# Patient Record
Sex: Female | Born: 1935 | Race: Black or African American | Hispanic: No | Smoking: Never smoker
Health system: Southern US, Community
[De-identification: ages and names within clinical notes are randomized; demographics above are authoritative.]

## PROBLEM LIST (undated history)

## (undated) DIAGNOSIS — N3941 Urge incontinence: Secondary | ICD-10-CM

## (undated) DIAGNOSIS — F0280 Dementia in other diseases classified elsewhere without behavioral disturbance: Secondary | ICD-10-CM

## (undated) DIAGNOSIS — L89154 Pressure ulcer of sacral region, stage 4: Secondary | ICD-10-CM

## (undated) DIAGNOSIS — R54 Age-related physical debility: Secondary | ICD-10-CM

## (undated) DIAGNOSIS — G3184 Mild cognitive impairment, so stated: Secondary | ICD-10-CM

## (undated) DIAGNOSIS — Z96651 Presence of right artificial knee joint: Secondary | ICD-10-CM

## (undated) DIAGNOSIS — Z48817 Encounter for surgical aftercare following surgery on the skin and subcutaneous tissue: Secondary | ICD-10-CM

## (undated) DIAGNOSIS — E8809 Other disorders of plasma-protein metabolism, not elsewhere classified: Secondary | ICD-10-CM

## (undated) DIAGNOSIS — M159 Polyosteoarthritis, unspecified: Secondary | ICD-10-CM

## (undated) DIAGNOSIS — G309 Alzheimer's disease, unspecified: Secondary | ICD-10-CM

## (undated) DIAGNOSIS — G8929 Other chronic pain: Secondary | ICD-10-CM

## (undated) DIAGNOSIS — E119 Type 2 diabetes mellitus without complications: Secondary | ICD-10-CM

## (undated) DIAGNOSIS — R5381 Other malaise: Secondary | ICD-10-CM

## (undated) DIAGNOSIS — L89610 Pressure ulcer of right heel, unstageable: Secondary | ICD-10-CM

## (undated) DIAGNOSIS — F0390 Unspecified dementia without behavioral disturbance: Secondary | ICD-10-CM

## (undated) DIAGNOSIS — E440 Moderate protein-calorie malnutrition: Secondary | ICD-10-CM

## (undated) DIAGNOSIS — K5900 Constipation, unspecified: Secondary | ICD-10-CM

## (undated) DIAGNOSIS — M48061 Spinal stenosis, lumbar region without neurogenic claudication: Secondary | ICD-10-CM

## (undated) DIAGNOSIS — M545 Low back pain, unspecified: Secondary | ICD-10-CM

## (undated) DIAGNOSIS — E039 Hypothyroidism, unspecified: Secondary | ICD-10-CM

## (undated) DIAGNOSIS — S82301A Unspecified fracture of lower end of right tibia, initial encounter for closed fracture: Secondary | ICD-10-CM

## (undated) HISTORY — DX: Other disorders of plasma-protein metabolism, not elsewhere classified: E88.09

## (undated) HISTORY — DX: Urge incontinence: N39.41

## (undated) HISTORY — DX: Low back pain, unspecified: M54.50

## (undated) HISTORY — PX: OTHER SURGICAL HISTORY: SHX169

## (undated) HISTORY — DX: Moderate protein-calorie malnutrition: E44.0

## (undated) HISTORY — DX: Constipation, unspecified: K59.00

## (undated) HISTORY — DX: Dementia in other diseases classified elsewhere, unspecified severity, without behavioral disturbance, psychotic disturbance, mood disturbance, and anxiety: F02.80

## (undated) HISTORY — DX: Pressure ulcer of sacral region, stage 4: L89.154

## (undated) HISTORY — DX: Age-related physical debility: R54

## (undated) HISTORY — DX: Spinal stenosis, lumbar region without neurogenic claudication: M48.061

## (undated) HISTORY — DX: Low back pain: M54.5

## (undated) HISTORY — DX: Unspecified dementia, unspecified severity, without behavioral disturbance, psychotic disturbance, mood disturbance, and anxiety: F03.90

## (undated) HISTORY — DX: Unspecified fracture of lower end of right tibia, initial encounter for closed fracture: S82.301A

## (undated) HISTORY — DX: Type 2 diabetes mellitus without complications: E11.9

## (undated) HISTORY — DX: Alzheimer's disease, unspecified: G30.9

## (undated) HISTORY — DX: Pressure ulcer of right heel, unstageable: L89.610

## (undated) HISTORY — DX: Encounter for surgical aftercare following surgery on the skin and subcutaneous tissue: Z48.817

## (undated) HISTORY — DX: Presence of right artificial knee joint: Z96.651

## (undated) HISTORY — DX: Polyosteoarthritis, unspecified: M15.9

## (undated) HISTORY — DX: Other chronic pain: G89.29

## (undated) HISTORY — DX: Other malaise: R53.81

## (undated) HISTORY — DX: Hypothyroidism, unspecified: E03.9

## (undated) HISTORY — DX: Mild cognitive impairment of uncertain or unknown etiology: G31.84

## (undated) HISTORY — PX: TOTAL KNEE ARTHROPLASTY: SHX125

---

## 2017-03-20 DIAGNOSIS — L89610 Pressure ulcer of right heel, unstageable: Secondary | ICD-10-CM | POA: Insufficient documentation

## 2017-03-20 DIAGNOSIS — E440 Moderate protein-calorie malnutrition: Secondary | ICD-10-CM

## 2017-03-20 DIAGNOSIS — L89154 Pressure ulcer of sacral region, stage 4: Secondary | ICD-10-CM

## 2017-03-20 DIAGNOSIS — Z48817 Encounter for surgical aftercare following surgery on the skin and subcutaneous tissue: Secondary | ICD-10-CM

## 2017-03-20 DIAGNOSIS — E8809 Other disorders of plasma-protein metabolism, not elsewhere classified: Secondary | ICD-10-CM

## 2017-03-20 DIAGNOSIS — R5381 Other malaise: Secondary | ICD-10-CM

## 2017-03-20 HISTORY — DX: Other disorders of plasma-protein metabolism, not elsewhere classified: E88.09

## 2017-03-20 HISTORY — DX: Pressure ulcer of sacral region, stage 4: L89.154

## 2017-03-20 HISTORY — DX: Pressure ulcer of right heel, unstageable: L89.610

## 2017-03-20 HISTORY — DX: Other malaise: R53.81

## 2017-03-20 HISTORY — DX: Encounter for surgical aftercare following surgery on the skin and subcutaneous tissue: Z48.817

## 2017-03-20 HISTORY — DX: Moderate protein-calorie malnutrition: E44.0

## 2017-05-20 ENCOUNTER — Non-Acute Institutional Stay (SKILLED_NURSING_FACILITY): Payer: Medicare Other | Admitting: Internal Medicine

## 2017-05-20 ENCOUNTER — Encounter: Payer: Self-pay | Admitting: Internal Medicine

## 2017-05-20 DIAGNOSIS — K219 Gastro-esophageal reflux disease without esophagitis: Secondary | ICD-10-CM

## 2017-05-20 DIAGNOSIS — I10 Essential (primary) hypertension: Secondary | ICD-10-CM

## 2017-05-20 DIAGNOSIS — F0280 Dementia in other diseases classified elsewhere without behavioral disturbance: Secondary | ICD-10-CM | POA: Diagnosis not present

## 2017-05-20 DIAGNOSIS — E43 Unspecified severe protein-calorie malnutrition: Secondary | ICD-10-CM

## 2017-05-20 DIAGNOSIS — M159 Polyosteoarthritis, unspecified: Secondary | ICD-10-CM | POA: Insufficient documentation

## 2017-05-20 DIAGNOSIS — S82301A Unspecified fracture of lower end of right tibia, initial encounter for closed fracture: Secondary | ICD-10-CM | POA: Insufficient documentation

## 2017-05-20 DIAGNOSIS — G8929 Other chronic pain: Secondary | ICD-10-CM | POA: Insufficient documentation

## 2017-05-20 DIAGNOSIS — E119 Type 2 diabetes mellitus without complications: Secondary | ICD-10-CM | POA: Diagnosis not present

## 2017-05-20 DIAGNOSIS — G3184 Mild cognitive impairment, so stated: Secondary | ICD-10-CM | POA: Insufficient documentation

## 2017-05-20 DIAGNOSIS — A414 Sepsis due to anaerobes: Secondary | ICD-10-CM

## 2017-05-20 DIAGNOSIS — N3941 Urge incontinence: Secondary | ICD-10-CM | POA: Insufficient documentation

## 2017-05-20 DIAGNOSIS — M545 Low back pain: Secondary | ICD-10-CM

## 2017-05-20 DIAGNOSIS — L89310 Pressure ulcer of right buttock, unstageable: Secondary | ICD-10-CM

## 2017-05-20 DIAGNOSIS — F0390 Unspecified dementia without behavioral disturbance: Secondary | ICD-10-CM | POA: Insufficient documentation

## 2017-05-20 DIAGNOSIS — I824Y2 Acute embolism and thrombosis of unspecified deep veins of left proximal lower extremity: Secondary | ICD-10-CM

## 2017-05-20 DIAGNOSIS — G301 Alzheimer's disease with late onset: Secondary | ICD-10-CM | POA: Diagnosis not present

## 2017-05-20 DIAGNOSIS — M48061 Spinal stenosis, lumbar region without neurogenic claudication: Secondary | ICD-10-CM | POA: Insufficient documentation

## 2017-05-20 DIAGNOSIS — Z96651 Presence of right artificial knee joint: Secondary | ICD-10-CM | POA: Insufficient documentation

## 2017-05-20 DIAGNOSIS — E034 Atrophy of thyroid (acquired): Secondary | ICD-10-CM | POA: Diagnosis not present

## 2017-05-20 DIAGNOSIS — D649 Anemia, unspecified: Secondary | ICD-10-CM | POA: Diagnosis not present

## 2017-05-20 DIAGNOSIS — R54 Age-related physical debility: Secondary | ICD-10-CM | POA: Insufficient documentation

## 2017-05-20 DIAGNOSIS — G309 Alzheimer's disease, unspecified: Secondary | ICD-10-CM

## 2017-05-20 DIAGNOSIS — K5900 Constipation, unspecified: Secondary | ICD-10-CM | POA: Insufficient documentation

## 2017-05-20 DIAGNOSIS — E039 Hypothyroidism, unspecified: Secondary | ICD-10-CM | POA: Insufficient documentation

## 2017-05-20 DIAGNOSIS — L89154 Pressure ulcer of sacral region, stage 4: Secondary | ICD-10-CM | POA: Diagnosis not present

## 2017-05-20 NOTE — Progress Notes (Signed)
: Provider: Randon Goldsmith. Lyn Hollingshead, MD  Location:  [facility] Room Number: 323 Place of Service:  SNF ([phone])  PCP: Margit Hanks, MD Patient Care Team: Margit Hanks, MD as PCP - General (Internal Medicine)  Extended Emergency Contact Information Primary Emergency Contact: [facility] Phone: [phone] Relation: Daughter Secondary Emergency Contact: Lewis,Tammy Mobile Phone: [phone] Relation: Other     Allergies: Januvia [sitagliptin]  Chief Complaint  Patient presents with  . New Admit To SNF    following hospitalization [facility] 03/20/17 to 05/17/17 Stage III/IV sacral decubitus with undermening and eschar    HPI: Patient is 83 y.o. female with dementia, diabetes mellitus type 2, osteoarthritis, hypothyroidism, who [facility] with an infected sacral pressure injury. Patient had recently been discharged from this same facility after admission for her to thrive. Patient was admitted to [facility] health system from 10/3-11/30 where she underwent surgical debridement and placement of a wound VAC for his stage IV inspected sacral pressure ulcer. Patient also had Peptostreptococcus species bacteremia for which she was treated with IV Zosyn and additional 14 days of by mouth Augmentin planned for after discharge. There was reported much conversation between family and staff about placing of a PEG tube due to an albumin level of 1.0; however family refused an patient's albumin on discharge is 1.8. Further complications were development of a DVT of the left lower extremity initially treated with xarelto which caused nosebleed so patient was placed on aspirin and had an inferior vena cava filter placed. She also had mild hyponatremia which had resolved just prior to discharge. Patient is admitted to skilled nursing facility with generalized weakness for OT/PT. While at skilled nursing facility patient will be  followed for diabetes mellitus treated with insulin, hypothyroidism treated with Synthroid and hypertension treated with Norvasc.   Past Medical History:  Diagnosis Date  . Aftercare following surgery of the skin or subcutaneous tissue 03/20/2017  . Alzheimer disease   . Chronic low back pain   . Closed fracture of right distal tibia   . Constipation   . Debility 03/20/2017  . Dementia without behavioral disturbance   . Diabetes mellitus without complication (HCC)   . Frailty syndrome in geriatric patient   . Hypoalbuminemia 03/20/2017  . Hypothyroidism   . Mild cognitive impairment, so stated   . Osteoarthritis of multiple joints    with replacement surgeries  . Protein-calorie malnutrition, moderate (HCC) 03/20/2017  . Spinal stenosis at L4-L5 level   . Stage IV pressure ulcer of sacral region (HCC) 03/20/2017  . Total knee replacement status, right   . Type 2 diabetes mellitus (HCC)   . Unstageable pressure ulcer of right heel (HCC) 03/20/2017  . Urge incontinence     Past Surgical History:  Procedure Laterality Date  . distal tibia fracture repair Right   . TOTAL KNEE ARTHROPLASTY Right     Allergies as of 05/20/2017      Reactions   Januvia [sitagliptin]    transaminitis      Medication List        Accurate as of 05/20/17  4:19 PM. Always use your most recent med list.          amLODipine 5 MG tablet Commonly known as:  NORVASC Take 5 mg by mouth. Take one tablet daily for HTN. Hold for SBP less than 120   amoxicillin-clavulanate 500-125 MG tablet Commonly known as:  AUGMENTIN Take 1 tablet  by mouth. Take one tablet every 12 hours for 14 days for wound infection. Stop 05/31/17   aspirin 81 MG chewable tablet Chew by mouth daily.   GLUCERNA Liqd Take 237 mLs by mouth. Take 237 ml twice daily due to weight loss   HUMALOG KWIKPEN 100 UNIT/ML KiwkPen Generic drug:  insulin lispro Inject into the skin. Sliding scale 70-120 =0 units, 121-150= 2 units,  151-200= 3 units, 201-250=5 units, 251-300=8 units, 301-350=11 units, 351-400=15 units. Greater than 400 call MD and give 15 units.   levothyroxine 112 MCG tablet Commonly known as:  SYNTHROID, LEVOTHROID Take 112 mcg by mouth daily before breakfast.   oxyCODONE-acetaminophen 5-325 MG tablet Commonly known as:  PERCOCET/ROXICET Take by mouth. Take one tablet daily 30 minute prior to wound care   pantoprazole 40 MG tablet Commonly known as:  PROTONIX Take 40 mg by mouth. Take one tablet daily   polyethylene glycol packet Commonly known as:  MIRALAX / GLYCOLAX Take 17 g by mouth. Mix with 4 oz of liquid daily for constipation   PROSTATE PO Take by mouth. Take 30 ml twice daily to aid in wound healing   vitamin C 500 MG tablet Commonly known as:  ASCORBIC ACID Take 500 mg by mouth. Take one tablet twice daily to promote wound healing   zinc sulfate 220 (50 Zn) MG capsule Take 220 mg by mouth. Take one capsule daily for 14 days to promote wound healing. Stop 05/31/17       No orders of the defined types were placed in this encounter.   Immunization History  Administered Date(s) Administered  . Influenza-Unspecified 04/18/2017  . PPD Test 05/17/2017    Social History   Tobacco Use  . Smoking status: Never Smoker  . Smokeless tobacco: Never Used  Substance Use Topics  . Alcohol use: No    Frequency: Never    Family history is   History reviewed. No pertinent family history.    Review of Systems    Vitals:   05/20/17 1601  BP: 107/60  Pulse: (!) 103  Resp: 18  Temp: 98.2 F (36.8 C)    SpO2 Readings from Last 1 Encounters:  No data found for SpO2   Body mass index is 27.43 kg/m.     Physical Exam  GENERAL APPEARANCE: Alert,  No acute distress.  SKIN: Stage IV decubitus sacrum and unstageable right gluteal ulcer dressed and not visualized HEAD: Normocephalic, atraumatic  EYES: Conjunctiva/lids clear. Pupils round, reactive. EOMs intact.  EARS:  External exam WNL, canals clear. Hearing grossly normal.  NOSE: No deformity or discharge.  MOUTH/THROAT: Lips w/o lesions  RESPIRATORY: Breathing is even, unlabored. Lung sounds are clear   CARDIOVASCULAR: Heart RRR no murmurs, rubs or gallops. No peripheral edema.   GASTROINTESTINAL: Abdomen is soft, non-tender, not distended w/ normal bowel sounds. GENITOURINARY: Bladder non tender, not distended  MUSCULOSKELETAL: No abnormal joints or musculature NEUROLOGIC:  Cranial nerves 2-12 grossly intact. Moves all extremities  PSYCHIATRIC: Mood and affect with dementia, no behavioral issues  Patient Active Problem List   Diagnosis Date Noted  . Alzheimer disease   . Chronic low back pain   . Closed fracture of right distal tibia   . Constipation   . Dementia without behavioral disturbance   . Diabetes mellitus without complication (HCC)   . Frailty syndrome in geriatric patient   . Hypothyroidism   . Mild cognitive impairment, so stated   . Osteoarthritis of multiple joints   . Spinal stenosis  at L4-L5 level   . Total knee replacement status, right   . Type 2 diabetes mellitus (HCC)   . Urge incontinence   . Aftercare following surgery of the skin or subcutaneous tissue 03/20/2017  . Debility 03/20/2017  . Hypoalbuminemia 03/20/2017  . Protein-calorie malnutrition, moderate (HCC) 03/20/2017  . Stage IV pressure ulcer of sacral region (HCC) 03/20/2017  . Unstageable pressure ulcer of right heel (HCC) 03/20/2017      Labs reviewed: Basic Metabolic Panel: No results found for: NA, K, CL, CO2, GLUCOSE, BUN, CREATININE, CALCIUM, PROT, ALBUMIN, AST, ALT, ALKPHOS, BILITOT, GFRNONAA, GFRAA  No results for input(s): NA, K, CL, CO2, GLUCOSE, BUN, CREATININE, CALCIUM, MG, PHOS in the last 8760 hours. Liver Function Tests: No results for input(s): AST, ALT, ALKPHOS, BILITOT, PROT, ALBUMIN in the last 8760 hours. No results for input(s): LIPASE, AMYLASE in the last 8760 hours. No results  for input(s): AMMONIA in the last 8760 hours. CBC: No results for input(s): WBC, NEUTROABS, HGB, HCT, MCV, PLT in the last 8760 hours. Lipid No results for input(s): CHOL, HDL, LDLCALC, TRIG in the last 8760 hours.  Cardiac Enzymes: No results for input(s): CKTOTAL, CKMB, CKMBINDEX, TROPONINI in the last 8760 hours. BNP: No results for input(s): BNP in the last 8760 hours. No results found for: MICROALBUR No results found for: HGBA1C No results found for: TSH No results found for: VITAMINB12 No results found for: FOLATE No results found for: IRON, TIBC, FERRITIN  Imaging and Procedures obtained prior to SNF admission: Patient was never admitted.   Not all labs, radiology exams or other studies done during hospitalization come through on my EPIC note; however they are reviewed by me.    Assessment and Plan  Infected stage IV sacral decubitus ulcer/unstageable right gluteal ulcer-status post surgical debridement on 04/04/2017 with placement of wound VAC SNF -admitted to skilled nursing facility for OT/PT and for wound care of sacral decubitus ulcer and right gluteal ulcer  PEPTOSTREPTOCOCCUS bacteremia-treated with 28 days of Zosyn SNF - Augmentin 500 mg every 12 hours for 14 more days in date 05/31/2017  SEVERE PROTEIN MALNUTRITION-patient's albumin was 1 on admission and 1.8 on discharge 2 months later  SNF-low-carb diet with multiple supplements  LEFT LOWER EXTREMITY DVT-was started on xarelto had nosebleeds so was changed to aspirin 81 mg and a inferior vena cava filter was placed. SNF - continue ASA 81 mg daily as prophylaxis  Diabetes mellitus type 2 SNF - nothing useful like a laboratory accompanies all these papers; was not stated as a problem therefore was admitted were no problems; plan to continue sliding scale insulin before meals   DEMENTIA WITHOUT BEHAVIORS-patient on no specific medications for Alzheimer's, it appears the patient has been on Risperdal 2 mg twice  a day which was stopped during her hospitalization SNF - will monitor patient had antipsychotics if needed  HYPOTHYROIDISM SNF - not stated as uncontrolled; plan to continue Synthroid 112 g daily  HYPERTENSION SNF - not stated as uncontrolled; plan to continue Norvasc 5 mg by mouth daily  GERD SNF - not stated as uncontrolled; plan to continue Protonix 40 mg by mouth daily  CHRONIC ANEMIA SNF - type not stated; no labs; have ordered CBC   Time spent greater than 45 minutes ;> 50% of time with patient was spent reviewing records, labs, tests and studies, counseling and developing plan of care  Thurston Holenne D. Lyn HollingsheadAlexander, MD

## 2017-05-23 ENCOUNTER — Other Ambulatory Visit: Payer: Self-pay

## 2017-05-23 LAB — BASIC METABOLIC PANEL
BUN: 13 (ref 4–21)
CREATININE: 0.3 — AB (ref 0.5–1.1)
GLUCOSE: 121
POTASSIUM: 4.7 (ref 3.4–5.3)
SODIUM: 132 — AB (ref 137–147)

## 2017-05-23 LAB — CBC AND DIFFERENTIAL
HCT: 31 — AB (ref 36–46)
Hemoglobin: 10.3 — AB (ref 12.0–16.0)
Platelets: 357 (ref 150–399)
WBC: 6.7

## 2017-05-28 ENCOUNTER — Encounter: Payer: Self-pay | Admitting: Internal Medicine

## 2017-05-28 DIAGNOSIS — I82409 Acute embolism and thrombosis of unspecified deep veins of unspecified lower extremity: Secondary | ICD-10-CM | POA: Insufficient documentation

## 2017-05-28 DIAGNOSIS — I10 Essential (primary) hypertension: Secondary | ICD-10-CM | POA: Insufficient documentation

## 2017-05-28 DIAGNOSIS — A414 Sepsis due to anaerobes: Secondary | ICD-10-CM | POA: Insufficient documentation

## 2017-05-28 DIAGNOSIS — L89300 Pressure ulcer of unspecified buttock, unstageable: Secondary | ICD-10-CM | POA: Insufficient documentation

## 2017-05-28 DIAGNOSIS — K219 Gastro-esophageal reflux disease without esophagitis: Secondary | ICD-10-CM | POA: Insufficient documentation

## 2017-05-28 DIAGNOSIS — D649 Anemia, unspecified: Secondary | ICD-10-CM | POA: Insufficient documentation

## 2017-05-28 DIAGNOSIS — E43 Unspecified severe protein-calorie malnutrition: Secondary | ICD-10-CM | POA: Insufficient documentation

## 2017-06-03 LAB — HEPATIC FUNCTION PANEL
ALK PHOS: 175 — AB (ref 25–125)
ALT: 24 (ref 7–35)
AST: 48 — AB (ref 13–35)
BILIRUBIN, TOTAL: 0.2

## 2017-06-03 LAB — BASIC METABOLIC PANEL
BUN: 14 (ref 4–21)
Creatinine: 0.4 — AB (ref 0.5–1.1)
GLUCOSE: 229
Potassium: 4.2 (ref 3.4–5.3)
SODIUM: 139 (ref 137–147)

## 2017-06-03 LAB — CBC AND DIFFERENTIAL
HEMATOCRIT: 34 — AB (ref 36–46)
HEMOGLOBIN: 10.9 — AB (ref 12.0–16.0)
Platelets: 363 (ref 150–399)
WBC: 9

## 2017-06-04 ENCOUNTER — Other Ambulatory Visit: Payer: Self-pay

## 2017-06-12 LAB — BASIC METABOLIC PANEL
BUN: 17 (ref 4–21)
Creatinine: 0.4 — AB (ref 0.5–1.1)
GLUCOSE: 174
POTASSIUM: 4 (ref 3.4–5.3)
SODIUM: 141 (ref 137–147)

## 2017-06-12 LAB — CBC AND DIFFERENTIAL
HCT: 31 — AB (ref 36–46)
HEMOGLOBIN: 9.6 — AB (ref 12.0–16.0)
Platelets: 353 (ref 150–399)
WBC: 9.5

## 2017-06-12 LAB — HEPATIC FUNCTION PANEL
ALK PHOS: 132 — AB (ref 25–125)
ALT: 14 (ref 7–35)
AST: 22 (ref 13–35)
Bilirubin, Total: 0.3

## 2017-06-17 ENCOUNTER — Non-Acute Institutional Stay (SKILLED_NURSING_FACILITY): Payer: Medicare Other | Admitting: Internal Medicine

## 2017-06-17 DIAGNOSIS — M7989 Other specified soft tissue disorders: Secondary | ICD-10-CM

## 2017-06-19 ENCOUNTER — Encounter: Payer: Self-pay | Admitting: Internal Medicine

## 2017-06-19 NOTE — Progress Notes (Addendum)
Location:  [facility] Room Number: 323 Place of Service:  SNF [phone])  Provider: Randon Goldsmith. Lyn Hollingshead, MD  Margit Hanks, MD  Patient Care Team: Margit Hanks, MD as PCP - General (Internal Medicine)  Extended Emergency Contact Information Primary Emergency Contact: [facility] Phone: [phone] Relation: Daughter Secondary Emergency Contact: Lewis,Tammy Mobile Phone: [phone] Relation: Other    Allergies: Januvia [sitagliptin]  Chief Complaint  Patient presents with  . Acute Visit    swelling right upper arm    HPI: Patient is 84 y.o. female who nursing asked me to see today because they have noted that her right upper extremity is swollen. Receiving his insulin for maybe several days. Patient does have a PICC line in that extremity.  Past Medical History:  Diagnosis Date  . Aftercare following surgery of the skin or subcutaneous tissue 03/20/2017  . Alzheimer disease   . Chronic low back pain   . Closed fracture of right distal tibia   . Constipation   . Debility 03/20/2017  . Dementia without behavioral disturbance   . Diabetes mellitus without complication (HCC)   . Frailty syndrome in geriatric patient   . Hypoalbuminemia 03/20/2017  . Hypothyroidism   . Mild cognitive impairment, so stated   . Osteoarthritis of multiple joints    with replacement surgeries  . Protein-calorie malnutrition, moderate (HCC) 03/20/2017  . Spinal stenosis at L4-L5 level   . Stage IV pressure ulcer of sacral region (HCC) 03/20/2017  . Total knee replacement status, right   . Type 2 diabetes mellitus (HCC)   . Unstageable pressure ulcer of right heel (HCC) 03/20/2017  . Urge incontinence     Past Surgical History:  Procedure Laterality Date  . distal tibia fracture repair Right   . TOTAL KNEE ARTHROPLASTY Right     Allergies as of 06/17/2017      Reactions   Januvia [sitagliptin]    transaminitis      Medication List        Accurate as of 06/17/17 11:59 PM. Always use your most recent med list.          amLODipine 5 MG tablet Commonly known as:  NORVASC Take 5 mg by mouth. Take one tablet daily for HTN. Hold for SBP less than 120   aspirin 81 MG chewable tablet Chew by mouth daily.   GLUCERNA Liqd Take 237 mLs by mouth. Take 237 ml twice daily due to weight loss   HUMALOG KWIKPEN 100 UNIT/ML KiwkPen Generic drug:  insulin lispro Inject into the skin. Sliding scale 70-120 =0 units, 121-150= 2 units, 151-200= 3 units, 201-250=5 units, 251-300=8 units, 301-350=11 units, 351-400=15 units. Greater than 400 call MD and give 15 units.   levothyroxine 112 MCG tablet Commonly known as:  SYNTHROID, LEVOTHROID Take 112 mcg by mouth daily before breakfast.   oxyCODONE-acetaminophen 5-325 MG tablet Commonly known as:  PERCOCET/ROXICET Take by mouth. Take one tablet daily 30 minute prior to wound care   pantoprazole 40 MG tablet Commonly known as:  PROTONIX Take 40 mg by mouth. Take one tablet daily   polyethylene glycol packet Commonly known as:  MIRALAX / GLYCOLAX Take 17 g by mouth. Mix with 4 oz of liquid daily for constipation   PROSTATE PO Take by mouth. Take 30 ml twice daily to aid in wound healing   vitamin C 500 MG tablet Commonly known as:  ASCORBIC ACID Take 500 mg by mouth. Take one tablet twice daily  to promote wound healing       No orders of the defined types were placed in this encounter.   Immunization History  Administered Date(s) Administered  . Influenza-Unspecified 04/18/2017  . PPD Test 05/17/2017    Social History   Tobacco Use  . Smoking status: Never Smoker  . Smokeless tobacco: Never Used  Substance Use Topics  . Alcohol use: No    Frequency: Never    Review of Systems  DATA OBTAINED: from patient-. Limited; nursing-as per history of present illness GENERAL:  no fevers, fatigue, appetite changes SKIN: No itching, rash HEENT: No complaint RESPIRATORY:  No cough, wheezing, SOB CARDIAC: No chest pain, palpitations, right upper extremity edema  GI: No abdominal pain, No N/V/D or constipation, No heartburn or reflux  GU: No dysuria, frequency or urgency, or incontinence  MUSCULOSKELETAL: No unrelieved bone/joint pain NEUROLOGIC: No headache, dizziness  PSYCHIATRIC: No overt anxiety or sadness  Vitals:   06/17/17 1545  BP: 112/69  Pulse: (!) 110  Resp: 18  Temp: (!) 97 F (36.1 C)  SpO2: 97%   Body mass index is 28.17 kg/m. Physical Exam  GENERAL APPEARANCE: Alert, No acute distress  SKIN: No diaphoresis rash HEENT: Unremarkable RESPIRATORY: Breathing is even, unlabored. Lung sounds are clear   CARDIOVASCULAR: Heart RRR 4/6 high pitched murmur, no rubs or gallops. RUE with significant swelling GASTROINTESTINAL: Abdomen is soft, non-tender, not distended w/ normal bowel sounds.  GENITOURINARY: Bladder non tender, not distended  MUSCULOSKELETAL: No abnormal joints or musculature NEUROLOGIC: Cranial nerves 2-12 grossly intact. Moves all extremities PSYCHIATRIC: Mood and affect with dementia, no behavioral issues  Patient Active Problem List   Diagnosis Date Noted  . Decubitus ulcer of buttock, unstageable (HCC) 05/28/2017  . Sepsis due to Peptostreptococcus species (HCC) 05/28/2017  . Severe protein-calorie malnutrition (HCC) 05/28/2017  . Acute deep vein thrombosis (DVT) of lower extremity (HCC) 05/28/2017  . Hypertension 05/28/2017  . GERD (gastroesophageal reflux disease) 05/28/2017  . Chronic anemia 05/28/2017  . Alzheimer disease   . Chronic low back pain   . Closed fracture of right distal tibia   . Constipation   . Dementia without behavioral disturbance   . Diabetes mellitus without complication (HCC)   . Frailty syndrome in geriatric patient   . Hypothyroidism   . Mild cognitive impairment, so stated   . Osteoarthritis of multiple joints   . Spinal stenosis at L4-L5 level   . Total knee replacement status, right    . Type 2 diabetes mellitus (HCC)   . Urge incontinence   . Aftercare following surgery of the skin or subcutaneous tissue 03/20/2017  . Debility 03/20/2017  . Hypoalbuminemia 03/20/2017  . Protein-calorie malnutrition, moderate (HCC) 03/20/2017  . Stage IV pressure ulcer of sacral region (HCC) 03/20/2017  . Unstageable pressure ulcer of right heel (HCC) 03/20/2017    CMP     Component Value Date/Time   NA 139 06/03/2017   K 4.2 06/03/2017   BUN 14 06/03/2017   CREATININE 0.4 (A) 06/03/2017   AST 48 (A) 06/03/2017   ALT 24 06/03/2017   ALKPHOS 175 (A) 06/03/2017   Recent Labs    05/23/17 06/03/17  NA 132* 139  K 4.7 4.2  BUN 13 14  CREATININE 0.3* 0.4*   Recent Labs    06/03/17  AST 48*  ALT 24  ALKPHOS 175*   Recent Labs    05/23/17 06/03/17  WBC 6.7 9.0  HGB 10.3* 10.9*  HCT 31* 34*  PLT  357 363   No results for input(s): CHOL, LDLCALC, TRIG in the last 8760 hours.  Invalid input(s): HCL No results found for: MICROALBUR No results found for: TSH No results found for: HGBA1C No results found for: CHOL, HDL, LDLCALC, LDLDIRECT, TRIG, CHOLHDL  Significant Diagnostic Results in last 30 days:  No results found.  Assessment and Plan  Right upper extremity swelling-patient is at risk for DVT with a PICC line indwelling; have ordered ultrasound    [name]. Lyn Hollingshead, MD

## 2017-06-23 ENCOUNTER — Encounter: Payer: Self-pay | Admitting: Internal Medicine

## 2017-06-24 LAB — CBC AND DIFFERENTIAL
HEMATOCRIT: 27 — AB (ref 36–46)
HEMOGLOBIN: 8.7 — AB (ref 12.0–16.0)
PLATELETS: 311 (ref 150–399)
WBC: 8.1

## 2017-06-24 LAB — BASIC METABOLIC PANEL
BUN: 13 (ref 4–21)
CREATININE: 0.3 — AB (ref 0.5–1.1)
Glucose: 152
Potassium: 3.2 — AB (ref 3.4–5.3)
Sodium: 142 (ref 137–147)

## 2017-06-24 LAB — HEPATIC FUNCTION PANEL
ALK PHOS: 137 — AB (ref 25–125)
ALT: 10 (ref 7–35)
AST: 18 (ref 13–35)
Bilirubin, Total: 0.4

## 2017-06-25 ENCOUNTER — Encounter: Payer: Self-pay | Admitting: Internal Medicine

## 2017-06-25 ENCOUNTER — Non-Acute Institutional Stay (SKILLED_NURSING_FACILITY): Payer: Medicare Other | Admitting: Internal Medicine

## 2017-06-25 DIAGNOSIS — K219 Gastro-esophageal reflux disease without esophagitis: Secondary | ICD-10-CM

## 2017-06-25 DIAGNOSIS — I10 Essential (primary) hypertension: Secondary | ICD-10-CM | POA: Diagnosis not present

## 2017-06-25 DIAGNOSIS — I824Y2 Acute embolism and thrombosis of unspecified deep veins of left proximal lower extremity: Secondary | ICD-10-CM | POA: Diagnosis not present

## 2017-06-25 NOTE — Progress Notes (Signed)
[facility] Room Number: 323P Place of Service:  SNF (31)  [name], Shakendra Griffeth D, MD  Patient Care Team: [name], Horton Ellithorpe D, MD as PCP - General (Internal Medicine)  Extended Emergency Contact Information Primary Emergency Contact: [facility]Mazie Mobile Phone: [phone] Relation: Daughter Secondary Emergency Contact: Lewis,Tammy Mobile Phone: [phone] Relation: Other    Allergies: Januvia [sitagliptin]  Chief Complaint  Patient presents with  . Medical Management of Chronic Issues    Routine Visit    HPI: Patient is 84 y.o. female who is being seen for routine issues of acute DVT, GERD, and hypertension.  Past Medical History:  Diagnosis Date  . Aftercare following surgery of the skin or subcutaneous tissue 03/20/2017  . Alzheimer disease   . Chronic low back pain   . Closed fracture of right distal tibia   . Constipation   . Debility 03/20/2017  . Dementia without behavioral disturbance   . Diabetes mellitus without complication (HCC)   . Frailty syndrome in geriatric patient   . Hypoalbuminemia 03/20/2017  . Hypothyroidism   . Mild cognitive impairment, so stated   . Osteoarthritis of multiple joints    with replacement surgeries  . Protein-calorie malnutrition, moderate (HCC) 03/20/2017  . Spinal stenosis at L4-L5 level   . Stage IV pressure ulcer of sacral region (HCC) 03/20/2017  . Total knee replacement status, right   . Type 2 diabetes mellitus (HCC)   . Unstageable pressure ulcer of right heel (HCC) 03/20/2017  . Urge incontinence     Past Surgical History:  Procedure Laterality Date  . distal tibia fracture repair Right   . TOTAL KNEE ARTHROPLASTY Right     Allergies as of 06/25/2017      Reactions   Januvia [sitagliptin]    transaminitis      Medication List        Accurate as of 06/25/17 11:59 PM. Always use your most recent med list.          amLODipine 5 MG tablet Commonly known as:   NORVASC Take 5 mg by mouth daily. Hold for SBP less than 120   aspirin 81 MG chewable tablet Chew by mouth daily.   GLUCERNA Liqd Take 237 mLs by mouth 2 (two) times daily between meals. due to weight loss   HUMALOG KWIKPEN 100 UNIT/ML KiwkPen Generic drug:  insulin lispro Inject into the skin. Sliding scale 70-120 =0 units, 121-150= 2 units, 151-200= 3 units, 201-250=5 units, 251-300=8 units, 301-350=11 units, 351-400=15 units. Greater than 400 call MD and give 15 units.   levothyroxine 112 MCG tablet Commonly known as:  SYNTHROID, LEVOTHROID Take 112 mcg by mouth daily before breakfast.   oxyCODONE-acetaminophen 5-325 MG tablet Commonly known as:  PERCOCET/ROXICET Take by mouth. Take one tablet daily 30 minute prior to wound care   pantoprazole 40 MG tablet Commonly known as:  PROTONIX Take 40 mg by mouth daily.   polyethylene glycol packet Commonly known as:  MIRALAX / GLYCOLAX Take 17 g by mouth daily.   PROSTATE PO Take by mouth. Take 30 ml twice daily to aid in wound healing   vitamin C 500 MG tablet Commonly known as:  ASCORBIC ACID Take 500 mg by mouth 2 (two) times daily. promote wound healing       No orders of the defined types were placed in this encounter.   Immunization History  Administered Date(s) Administered  . Influenza-Unspecified 04/18/2017  . PPD Test 05/17/2017    Social  History   Tobacco Use  . Smoking status: Never Smoker  . Smokeless tobacco: Never Used  Substance Use Topics  . Alcohol use: No    Frequency: Never    Review of Systems  unable to obtain secondary to dementia; nursing-no concerns except patient has poor appetite    Vitals:   06/25/17 1150  BP: 137/85  Pulse: 90  Resp: 19  Temp: 98.1 F (36.7 C)  SpO2: 98%   Body mass index is 27.12 kg/m. Physical Exam  GENERAL APPEARANCE: Alert,  No acute distress  SKIN: Stage IV sacral decubitus dressed and not visualized HEENT: Unremarkable RESPIRATORY: Breathing  is even, unlabored. Lung sounds are clear   CARDIOVASCULAR: Heart RRR no murmurs, rubs or gallops. No peripheral edema  GASTROINTESTINAL: Abdomen is soft, non-tender, not distended w/ normal bowel sounds.  GENITOURINARY: Bladder non tender, not distended  MUSCULOSKELETAL: No abnormal joints or musculature NEUROLOGIC: Cranial nerves 2-12 grossly intact. Moves all extremities PSYCHIATRIC: Mood and affect dementia, no behavioral issues  Patient Active Problem List   Diagnosis Date Noted  . Decubitus ulcer of buttock, unstageable (HCC) 05/28/2017  . Sepsis due to Peptostreptococcus species (HCC) 05/28/2017  . Severe protein-calorie malnutrition (HCC) 05/28/2017  . Acute deep vein thrombosis (DVT) of lower extremity (HCC) 05/28/2017  . Hypertension 05/28/2017  . GERD (gastroesophageal reflux disease) 05/28/2017  . Chronic anemia 05/28/2017  . Alzheimer disease   . Chronic low back pain   . Closed fracture of right distal tibia   . Constipation   . Dementia without behavioral disturbance   . Diabetes mellitus without complication (HCC)   . Frailty syndrome in geriatric patient   . Hypothyroidism   . Mild cognitive impairment, so stated   . Osteoarthritis of multiple joints   . Spinal stenosis at L4-L5 level   . Total knee replacement status, right   . Type 2 diabetes mellitus (HCC)   . Urge incontinence   . Aftercare following surgery of the skin or subcutaneous tissue 03/20/2017  . Debility 03/20/2017  . Hypoalbuminemia 03/20/2017  . Protein-calorie malnutrition, moderate (HCC) 03/20/2017  . Stage IV pressure ulcer of sacral region (HCC) 03/20/2017  . Unstageable pressure ulcer of right heel (HCC) 03/20/2017    CMP     Component Value Date/Time   NA 140 07/08/2017   NA 141 07/02/2017   K 4.3 07/08/2017   K 4.2 07/02/2017   BUN 12 07/08/2017   CREATININE 0.5 07/08/2017   CREATININE 0.39 07/02/2017   CALCIUM 8.3 07/02/2017   PROT 4.8 07/02/2017   ALBUMIN 2.2 07/02/2017    AST 23 07/08/2017   AST 19 07/02/2017   ALT 9 07/08/2017   ALT 9 07/02/2017   ALKPHOS 164 (A) 07/08/2017   ALKPHOS 153 07/02/2017   BILITOT 0.3 07/02/2017   Recent Labs    06/24/17 07/02/17 07/08/17  NA 142 141  141 140  K 3.2* 4.2  4.2 4.3  BUN 13 12 12   CREATININE 0.3* 0.4*  0.39 0.5  CALCIUM  --  8.3  --    Recent Labs    07/02/17 07/08/17  AST 19  19 23   ALT 9  9 9   ALKPHOS 153*  153 164*  BILITOT 0.3  --   PROT 4.8  --   ALBUMIN 2.2  --    Recent Labs    06/24/17 07/02/17 07/08/17  WBC 8.1 9.8  9.8 9.8  HGB 8.7* 8.5*  8.5 8.2*  HCT 27* 26*  25.8 26*  PLT 311 339 348   No results for input(s): CHOL, LDLCALC, TRIG in the last 8760 hours.  Invalid input(s): HCL No results found for: MICROALBUR No results found for: TSH No results found for: HGBA1C No results found for: CHOL, HDL, LDLCALC, LDLDIRECT, TRIG, CHOLHDL  Significant Diagnostic Results in last 30 days:  No results found.  Assessment and Plan  Acute deep vein thrombosis (DVT) of lower extremity (HCC) Patient developed nosebleed on xarelto so patient is now on ASA 81 mg and has a vena cava filter  Hypertension Will controlled; continue Norvasc 5 mg by mouth daily  GERD (gastroesophageal reflux disease) No reports of reflux; continue Protonix 40 mg by mouth daily    Sara Gross D.Lyn Hollingshead, MD

## 2017-07-02 LAB — COMPLETE METABOLIC PANEL WITH GFR
ALBUMIN: 2.2
ALT: 9
AST: 19
Alkaline Phosphatase: 153
BILIRUBIN TOTAL: 0.3
BUN: 12 (ref 4–21)
Calcium: 8.3
Creat: 0.39
GLUCOSE: 224
POTASSIUM: 4.2
SODIUM: 141
Total Protein: 4.8 g/dL

## 2017-07-02 LAB — CBC
HCT: 25.8
HEMOGLOBIN: 8.5
PLATELET COUNT: 339
WBC: 9.8

## 2017-07-02 LAB — CBC AND DIFFERENTIAL
HEMATOCRIT: 26 — AB (ref 36–46)
Hemoglobin: 8.5 — AB (ref 12.0–16.0)
Platelets: 339 (ref 150–399)
WBC: 9.8

## 2017-07-02 LAB — HEPATIC FUNCTION PANEL
ALK PHOS: 153 — AB (ref 25–125)
ALT: 9 (ref 7–35)
AST: 19 (ref 13–35)
Bilirubin, Total: 0.3

## 2017-07-02 LAB — BASIC METABOLIC PANEL
CREATININE: 0.4 — AB (ref 0.5–1.1)
GLUCOSE: 224
Potassium: 4.2 (ref 3.4–5.3)
Sodium: 141 (ref 137–147)

## 2017-07-03 ENCOUNTER — Encounter: Payer: Self-pay | Admitting: *Deleted

## 2017-07-08 LAB — HEPATIC FUNCTION PANEL
ALT: 9 (ref 7–35)
AST: 23 (ref 13–35)
Alkaline Phosphatase: 164 — AB (ref 25–125)
Bilirubin, Total: 0.3

## 2017-07-08 LAB — BASIC METABOLIC PANEL
BUN: 12 (ref 4–21)
Creatinine: 0.5 (ref 0.5–1.1)
GLUCOSE: 98
POTASSIUM: 4.3 (ref 3.4–5.3)
SODIUM: 140 (ref 137–147)

## 2017-07-08 LAB — CBC AND DIFFERENTIAL
HEMATOCRIT: 26 — AB (ref 36–46)
Hemoglobin: 8.2 — AB (ref 12.0–16.0)
PLATELETS: 348 (ref 150–399)
WBC: 9.8

## 2017-07-09 ENCOUNTER — Non-Acute Institutional Stay (SKILLED_NURSING_FACILITY): Payer: Medicare Other | Admitting: Internal Medicine

## 2017-07-09 ENCOUNTER — Encounter: Payer: Self-pay | Admitting: Internal Medicine

## 2017-07-09 DIAGNOSIS — E43 Unspecified severe protein-calorie malnutrition: Secondary | ICD-10-CM | POA: Diagnosis not present

## 2017-07-09 DIAGNOSIS — Z7189 Other specified counseling: Secondary | ICD-10-CM

## 2017-07-09 DIAGNOSIS — R627 Adult failure to thrive: Secondary | ICD-10-CM

## 2017-07-09 DIAGNOSIS — L89154 Pressure ulcer of sacral region, stage 4: Secondary | ICD-10-CM | POA: Diagnosis not present

## 2017-07-09 NOTE — Progress Notes (Signed)
[facility] Room Number: 323P Place of Service:  SNF (31)  Margit Hanks, MD  Patient Care Team: Margit Hanks, MD as PCP - General (Internal Medicine)  Extended Emergency Contact Information Primary Emergency Contact: [facility] Phone: [phone] Relation: Daughter Secondary Emergency Contact: Lewis,Tammy Mobile Phone: [phone] Relation: Other    Allergies: Januvia [sitagliptin]  Chief Complaint  Patient presents with  . Acute Visit    encounter for family conference    HPI: Patient is 84 y.o. female who is being seen today before I have a conference with her daughter to discuss patient's overall condition and goals of care.Nurse's made me aware that patient's daughter did not seem to understand how ill her mother was, how  little by mouth intake she was getting in, how her sacral decubitus was never going to heal and didn't seem to understand the overall picture.  Past Medical History:  Diagnosis Date  . Aftercare following surgery of the skin or subcutaneous tissue 03/20/2017  . Alzheimer disease   . Chronic low back pain   . Closed fracture of right distal tibia   . Constipation   . Debility 03/20/2017  . Dementia without behavioral disturbance   . Diabetes mellitus without complication (HCC)   . Frailty syndrome in geriatric patient   . Hypoalbuminemia 03/20/2017  . Hypothyroidism   . Mild cognitive impairment, so stated   . Osteoarthritis of multiple joints    with replacement surgeries  . Protein-calorie malnutrition, moderate (HCC) 03/20/2017  . Spinal stenosis at L4-L5 level   . Stage IV pressure ulcer of sacral region (HCC) 03/20/2017  . Total knee replacement status, right   . Type 2 diabetes mellitus (HCC)   . Unstageable pressure ulcer of right heel (HCC) 03/20/2017  . Urge incontinence     Past Surgical History:  Procedure Laterality Date  . distal tibia fracture repair Right   .  TOTAL KNEE ARTHROPLASTY Right     Allergies as of 07/09/2017      Reactions   Januvia [sitagliptin]    transaminitis      Medication List        Accurate as of 07/09/17 11:59 PM. Always use your most recent med list.          amLODipine 5 MG tablet Commonly known as:  NORVASC Take 5 mg by mouth daily. Hold for SBP less than 120   aspirin EC 81 MG tablet Take 81 mg by mouth daily.   feeding supplement (PRO-STAT SUGAR FREE 64) Liqd Take 30 mLs by mouth 2 (two) times daily.   GLUCERNA Liqd Take 237 mLs by mouth 2 (two) times daily between meals. due to weight loss   nutrition supplement (JUVEN) Pack Take 1 packet by mouth 2 (two) times daily between meals.   HUMALOG KWIKPEN 100 UNIT/ML KiwkPen Generic drug:  insulin lispro Inject into the skin. Sliding scale 70-120 =0 units, 121-150= 2 units, 151-200= 3 units, 201-250=5 units, 251-300=8 units, 301-350=11 units, 351-400=15 units. Greater than 400 call MD and give 15 units.   levothyroxine 112 MCG tablet Commonly known as:  SYNTHROID, LEVOTHROID Take 112 mcg by mouth daily before breakfast.   pantoprazole 40 MG tablet Commonly known as:  PROTONIX Take 40 mg by mouth daily.   polyethylene glycol packet Commonly known as:  MIRALAX / GLYCOLAX Take 17 g by mouth daily.   potassium chloride 10 MEQ tablet Commonly known as:  K-DUR,KLOR-CON Take 10 mEq by  mouth daily.   traMADol 50 MG tablet Commonly known as:  ULTRAM Take 50 mg by mouth every 8 (eight) hours as needed for moderate pain. Take 30 mins before wound care daily   vitamin C 500 MG tablet Commonly known as:  ASCORBIC ACID Take 500 mg by mouth 2 (two) times daily. promote wound healing       No orders of the defined types were placed in this encounter.   Immunization History  Administered Date(s) Administered  . Influenza-Unspecified 04/18/2017  . PPD Test 05/17/2017    Social History   Tobacco Use  . Smoking status: Never Smoker  . Smokeless  tobacco: Never Used  Substance Use Topics  . Alcohol use: No    Frequency: Never    Review of Systems  DATA OBTAINED: from nurse GENERAL:  no fevers,+ fatigue, +appetite changes SKIN: undermined sacral decubitus stage IV with osteomyelitis HEENT: No complaint RESPIRATORY: No cough, wheezing, SOB CARDIAC: No chest pain, palpitations, lower extremity edema  GI: No abdominal pain, No N/V/D or constipation, No heartburn or reflux  GU: No dysuria, frequency or urgency, or incontinence  MUSCULOSKELETAL: No unrelieved bone/joint pain NEUROLOGIC: No headache, dizziness  PSYCHIATRIC: No overt anxiety or sadness  Vitals:   07/09/17 1529  BP: 112/70  Pulse: 98  Resp: 18  Temp: 97.6 F (36.4 C)  SpO2: 99%   Body mass index is 26.99 kg/m. Physical Exam  GENERAL APPEARANCE: Alert, minimally conversant, No acute distress, looks very tired and weak  SKIN: No diaphoresis rash HEENT: Unremarkable RESPIRATORY: Breathing is even, unlabored. Lung sounds are clear   CARDIOVASCULAR: Heart RRR no murmurs, rubs or gallops. No peripheral edema  GASTROINTESTINAL: Abdomen is soft, non-tender, not distended w/ normal bowel sounds.  GENITOURINARY: Bladder non tender, not distended  MUSCULOSKELETAL: No abnormal joints or musculature NEUROLOGIC: Cranial nerves 2-12 grossly intact. Moves all extremities PSYCHIATRIC: Mood and affect appropriate to situation, no behavioral issues  Patient Active Problem List   Diagnosis Date Noted  . Atrial fibrillation with rapid ventricular response (HCC)   . Edema of upper extremity   . V-tach (HCC)   . HCAP (healthcare-associated pneumonia)   . Advance care planning   . Goals of care, counseling/discussion   . Palliative care by specialist   . Sepsis (HCC) 07/15/2017  . Pressure injury of skin 07/15/2017  . Decubitus ulcer of buttock, unstageable (HCC) 05/28/2017  . Sepsis due to Peptostreptococcus species (HCC) 05/28/2017  . Severe protein-calorie  malnutrition (HCC) 05/28/2017  . Acute deep vein thrombosis (DVT) of lower extremity (HCC) 05/28/2017  . Hypertension 05/28/2017  . GERD (gastroesophageal reflux disease) 05/28/2017  . Chronic anemia 05/28/2017  . Alzheimer disease   . Chronic low back pain   . Closed fracture of right distal tibia   . Constipation   . Dementia without behavioral disturbance   . Diabetes mellitus without complication (HCC)   . Frailty syndrome in geriatric patient   . Hypothyroidism   . Mild cognitive impairment, so stated   . Osteoarthritis of multiple joints   . Spinal stenosis at L4-L5 level   . Total knee replacement status, right   . Type 2 diabetes mellitus (HCC)   . Urge incontinence   . Aftercare following surgery of the skin or subcutaneous tissue 03/20/2017  . Debility 03/20/2017  . Hypoalbuminemia 03/20/2017  . Protein-calorie malnutrition, moderate (HCC) 03/20/2017  . Stage IV pressure ulcer of sacral region (HCC) 03/20/2017  . Unstageable pressure ulcer of right heel (HCC) 03/20/2017  CMP     Component Value Date/Time   NA 139 07/18/2017 0323   NA 140 07/08/2017   NA 141 07/02/2017   K 2.8 (L) 07/18/2017 0323   K 4.2 07/02/2017   CL 110 07/18/2017 0323   CO2 22 07/18/2017 0323   GLUCOSE 100 (H) 07/18/2017 0323   BUN 8 07/18/2017 0323   BUN 12 07/08/2017   CREATININE 0.44 07/18/2017 0323   CREATININE 0.39 07/02/2017   CALCIUM 7.8 (L) 07/18/2017 0323   CALCIUM 8.3 07/02/2017   PROT 5.4 (L) 07/16/2017 0756   PROT 4.8 07/02/2017   ALBUMIN 1.8 (L) 07/16/2017 0756   ALBUMIN 2.2 07/02/2017   AST 25 07/16/2017 0756   AST 19 07/02/2017   ALT 13 (L) 07/16/2017 0756   ALT 9 07/02/2017   ALKPHOS 126 07/16/2017 0756   ALKPHOS 153 07/02/2017   BILITOT 0.9 07/16/2017 0756   BILITOT 0.3 07/02/2017   GFRNONAA >60 07/18/2017 0323   GFRAA >60 07/18/2017 0323   Recent Labs    07/16/17 0756 07/17/17 0333 07/18/17 0323  NA 141 143 139  K 3.1* 3.7 2.8*  CL 109 112* 110  CO2  24 23 22   GLUCOSE 110* 113* 100*  BUN 16 13 8   CREATININE 0.56 0.52 0.44  CALCIUM 8.3* 8.2* 7.8*  MG 1.7  --   --    Recent Labs    07/02/17 07/08/17 07/15/17 1352 07/16/17 0756  AST 19  19 23  43* 25  ALT 9  9 9 14  13*  ALKPHOS 153*  153 164* 152* 126  BILITOT 0.3  --  0.5 0.9  PROT 4.8  --  5.8* 5.4*  ALBUMIN 2.2  --  1.9* 1.8*   Recent Labs    07/15/17 1352 07/16/17 0756 07/17/17 0333 07/18/17 0323  WBC 14.1* 12.5* 11.3* 10.2  NEUTROABS 11.0* 10.1*  --   --   HGB 8.7* 8.3* 8.0* 7.9*  HCT 27.2* 25.5* 24.8* 23.9*  MCV 80.7 79.9 79.7 78.4  PLT 331 371 375 387   No results for input(s): CHOL, LDLCALC, TRIG in the last 8760 hours.  Invalid input(s): HCL No results found for: MICROALBUR No results found for: TSH Lab Results  Component Value Date   HGBA1C 6.4 (H) 07/16/2017   No results found for: CHOL, HDL, LDLCALC, LDLDIRECT, TRIG, CHOLHDL  Significant Diagnostic Results in last 30 days:  Dg Chest 2 View  Result Date: 07/15/2017 CLINICAL DATA:  Cough, congestion and fever for several days. EXAM: CHEST  2 VIEW COMPARISON:  None. FINDINGS: The cardiomediastinal silhouette is unremarkable. Patchy bilateral airspace opacities, greatest in the left lower lobe, are suspicious for pneumonia. There may be a trace left pleural effusion present. No dominant mass or pneumothorax noted. No acute bony abnormalities are identified. IMPRESSION: Patchy bilateral airspace opacities, greatest in the left lower lobe-suspicious for pneumonia. Radiographic follow-up to resolution recommended. Electronically Signed   By: Harmon Pier M.D.   On: 07/15/2017 15:19    Assessment and Plan  Encounter for conference with patient present/stage IV sacral decubitus/severe protein malnutrition/failure to thrive-I had a very long and very frank conversation with patient's daughter, making her aware that her mother's sacral wound will probably never heal because her mother is not taking in enough  nutrition to sustain life herself much less heal a wound that large. I was very open with her and I think she understands, she just doesn't want understand. She did say that her mother used to visit people in  the nursing home and had always said to her that she never wanted to be like that. I made the daughter aware that her mother's prognosis is very poor. She does not want her mother to be intubated and she does not want her mother to have a feeding tube.. We decided that all other circumstances such as oxygen and IV fluids hospitalizations will discuss on a case-by-case basis for now.   Time spent approximately 1 hour 15 minutes;> 50% of time with patient was spent reviewing records, labs, tests and studies, counseling and developing plan of care  Randon Goldsmith. Lyn Hollingshead, MD

## 2017-07-13 ENCOUNTER — Encounter: Payer: Self-pay | Admitting: Internal Medicine

## 2017-07-13 NOTE — Assessment & Plan Note (Signed)
Patient developed nosebleed on xarelto so patient is now on ASA 81 mg and has a vena cava filter

## 2017-07-13 NOTE — Assessment & Plan Note (Signed)
No reports of reflux; continue Protonix 40 mg by mouth daily 

## 2017-07-13 NOTE — Assessment & Plan Note (Signed)
Will controlled; continue Norvasc 5 mg by mouth daily

## 2017-07-15 ENCOUNTER — Non-Acute Institutional Stay (SKILLED_NURSING_FACILITY): Payer: Medicare Other | Admitting: Internal Medicine

## 2017-07-15 ENCOUNTER — Encounter (HOSPITAL_COMMUNITY): Payer: Self-pay | Admitting: *Deleted

## 2017-07-15 ENCOUNTER — Encounter: Payer: Self-pay | Admitting: Internal Medicine

## 2017-07-15 ENCOUNTER — Emergency Department (HOSPITAL_COMMUNITY): Payer: Medicare Other

## 2017-07-15 ENCOUNTER — Inpatient Hospital Stay (HOSPITAL_COMMUNITY)
Admission: EM | Admit: 2017-07-15 | Discharge: 2017-07-19 | DRG: 698 | Disposition: A | Discharge status: Hospice | Payer: Medicare Other | Attending: Nephrology | Admitting: Nephrology

## 2017-07-15 ENCOUNTER — Other Ambulatory Visit: Payer: Self-pay

## 2017-07-15 DIAGNOSIS — Z792 Long term (current) use of antibiotics: Secondary | ICD-10-CM

## 2017-07-15 DIAGNOSIS — E876 Hypokalemia: Secondary | ICD-10-CM | POA: Diagnosis present

## 2017-07-15 DIAGNOSIS — R918 Other nonspecific abnormal finding of lung field: Secondary | ICD-10-CM | POA: Diagnosis not present

## 2017-07-15 DIAGNOSIS — R600 Localized edema: Secondary | ICD-10-CM | POA: Diagnosis not present

## 2017-07-15 DIAGNOSIS — Z66 Do not resuscitate: Secondary | ICD-10-CM | POA: Diagnosis present

## 2017-07-15 DIAGNOSIS — L89303 Pressure ulcer of unspecified buttock, stage 3: Secondary | ICD-10-CM | POA: Diagnosis not present

## 2017-07-15 DIAGNOSIS — M79609 Pain in unspecified limb: Secondary | ICD-10-CM | POA: Diagnosis not present

## 2017-07-15 DIAGNOSIS — K219 Gastro-esophageal reflux disease without esophagitis: Secondary | ICD-10-CM | POA: Diagnosis present

## 2017-07-15 DIAGNOSIS — R54 Age-related physical debility: Secondary | ICD-10-CM | POA: Diagnosis present

## 2017-07-15 DIAGNOSIS — R0902 Hypoxemia: Secondary | ICD-10-CM | POA: Diagnosis not present

## 2017-07-15 DIAGNOSIS — Z95828 Presence of other vascular implants and grafts: Secondary | ICD-10-CM

## 2017-07-15 DIAGNOSIS — Z79891 Long term (current) use of opiate analgesic: Secondary | ICD-10-CM

## 2017-07-15 DIAGNOSIS — E8809 Other disorders of plasma-protein metabolism, not elsewhere classified: Secondary | ICD-10-CM | POA: Diagnosis not present

## 2017-07-15 DIAGNOSIS — Z515 Encounter for palliative care: Secondary | ICD-10-CM | POA: Diagnosis not present

## 2017-07-15 DIAGNOSIS — Z79899 Other long term (current) drug therapy: Secondary | ICD-10-CM

## 2017-07-15 DIAGNOSIS — I4891 Unspecified atrial fibrillation: Secondary | ICD-10-CM

## 2017-07-15 DIAGNOSIS — I10 Essential (primary) hypertension: Secondary | ICD-10-CM | POA: Diagnosis present

## 2017-07-15 DIAGNOSIS — I472 Ventricular tachycardia, unspecified: Secondary | ICD-10-CM

## 2017-07-15 DIAGNOSIS — D649 Anemia, unspecified: Secondary | ICD-10-CM | POA: Diagnosis present

## 2017-07-15 DIAGNOSIS — A419 Sepsis, unspecified organism: Secondary | ICD-10-CM | POA: Diagnosis present

## 2017-07-15 DIAGNOSIS — R64 Cachexia: Secondary | ICD-10-CM | POA: Diagnosis present

## 2017-07-15 DIAGNOSIS — R627 Adult failure to thrive: Secondary | ICD-10-CM | POA: Diagnosis present

## 2017-07-15 DIAGNOSIS — M545 Low back pain: Secondary | ICD-10-CM | POA: Diagnosis present

## 2017-07-15 DIAGNOSIS — R6520 Severe sepsis without septic shock: Secondary | ICD-10-CM | POA: Diagnosis present

## 2017-07-15 DIAGNOSIS — M4628 Osteomyelitis of vertebra, sacral and sacrococcygeal region: Secondary | ICD-10-CM | POA: Diagnosis present

## 2017-07-15 DIAGNOSIS — J189 Pneumonia, unspecified organism: Secondary | ICD-10-CM | POA: Diagnosis present

## 2017-07-15 DIAGNOSIS — Z86718 Personal history of other venous thrombosis and embolism: Secondary | ICD-10-CM

## 2017-07-15 DIAGNOSIS — R5381 Other malaise: Secondary | ICD-10-CM | POA: Diagnosis not present

## 2017-07-15 DIAGNOSIS — F0280 Dementia in other diseases classified elsewhere without behavioral disturbance: Secondary | ICD-10-CM | POA: Diagnosis not present

## 2017-07-15 DIAGNOSIS — Z7401 Bed confinement status: Secondary | ICD-10-CM

## 2017-07-15 DIAGNOSIS — Y95 Nosocomial condition: Secondary | ICD-10-CM | POA: Diagnosis present

## 2017-07-15 DIAGNOSIS — Z7189 Other specified counseling: Secondary | ICD-10-CM

## 2017-07-15 DIAGNOSIS — R1310 Dysphagia, unspecified: Secondary | ICD-10-CM | POA: Diagnosis present

## 2017-07-15 DIAGNOSIS — Z6826 Body mass index (BMI) 26.0-26.9, adult: Secondary | ICD-10-CM

## 2017-07-15 DIAGNOSIS — L89154 Pressure ulcer of sacral region, stage 4: Secondary | ICD-10-CM | POA: Diagnosis present

## 2017-07-15 DIAGNOSIS — Z96651 Presence of right artificial knee joint: Secondary | ICD-10-CM | POA: Diagnosis present

## 2017-07-15 DIAGNOSIS — R1319 Other dysphagia: Secondary | ICD-10-CM | POA: Diagnosis not present

## 2017-07-15 DIAGNOSIS — M7989 Other specified soft tissue disorders: Secondary | ICD-10-CM | POA: Diagnosis not present

## 2017-07-15 DIAGNOSIS — J181 Lobar pneumonia, unspecified organism: Secondary | ICD-10-CM | POA: Diagnosis not present

## 2017-07-15 DIAGNOSIS — E872 Acidosis: Secondary | ICD-10-CM | POA: Diagnosis present

## 2017-07-15 DIAGNOSIS — Y846 Urinary catheterization as the cause of abnormal reaction of the patient, or of later complication, without mention of misadventure at the time of the procedure: Secondary | ICD-10-CM | POA: Diagnosis present

## 2017-07-15 DIAGNOSIS — Z79890 Hormone replacement therapy (postmenopausal): Secondary | ICD-10-CM

## 2017-07-15 DIAGNOSIS — G309 Alzheimer's disease, unspecified: Secondary | ICD-10-CM | POA: Diagnosis present

## 2017-07-15 DIAGNOSIS — T83511A Infection and inflammatory reaction due to indwelling urethral catheter, initial encounter: Principal | ICD-10-CM | POA: Diagnosis present

## 2017-07-15 DIAGNOSIS — E039 Hypothyroidism, unspecified: Secondary | ICD-10-CM | POA: Diagnosis present

## 2017-07-15 DIAGNOSIS — F0390 Unspecified dementia without behavioral disturbance: Secondary | ICD-10-CM | POA: Diagnosis not present

## 2017-07-15 DIAGNOSIS — Z794 Long term (current) use of insulin: Secondary | ICD-10-CM

## 2017-07-15 DIAGNOSIS — Z7982 Long term (current) use of aspirin: Secondary | ICD-10-CM

## 2017-07-15 DIAGNOSIS — L8990 Pressure ulcer of unspecified site, unspecified stage: Secondary | ICD-10-CM | POA: Diagnosis present

## 2017-07-15 DIAGNOSIS — E1169 Type 2 diabetes mellitus with other specified complication: Secondary | ICD-10-CM | POA: Diagnosis present

## 2017-07-15 DIAGNOSIS — G3183 Dementia with Lewy bodies: Secondary | ICD-10-CM | POA: Diagnosis not present

## 2017-07-15 DIAGNOSIS — Z888 Allergy status to other drugs, medicaments and biological substances status: Secondary | ICD-10-CM

## 2017-07-15 LAB — URINALYSIS, ROUTINE W REFLEX MICROSCOPIC
BILIRUBIN URINE: NEGATIVE
Glucose, UA: 50 mg/dL — AB
KETONES UR: 5 mg/dL — AB
Nitrite: POSITIVE — AB
Protein, ur: 30 mg/dL — AB
RENAL EPITHELIAL (UACOMP): 39
SPECIFIC GRAVITY, URINE: 1.021 (ref 1.005–1.030)
pH: 5 (ref 5.0–8.0)

## 2017-07-15 LAB — CBC WITH DIFFERENTIAL/PLATELET
BASOS ABS: 0 10*3/uL (ref 0.0–0.1)
BASOS PCT: 0 %
EOS ABS: 0 10*3/uL (ref 0.0–0.7)
Eosinophils Relative: 0 %
HCT: 27.2 % — ABNORMAL LOW (ref 36.0–46.0)
Hemoglobin: 8.7 g/dL — ABNORMAL LOW (ref 12.0–15.0)
Lymphocytes Relative: 15 %
Lymphs Abs: 2.1 10*3/uL (ref 0.7–4.0)
MCH: 25.8 pg — ABNORMAL LOW (ref 26.0–34.0)
MCHC: 32 g/dL (ref 30.0–36.0)
MCV: 80.7 fL (ref 78.0–100.0)
MONO ABS: 0.9 10*3/uL (ref 0.1–1.0)
MONOS PCT: 6 %
NEUTROS ABS: 11 10*3/uL — AB (ref 1.7–7.7)
NEUTROS PCT: 79 %
PLATELETS: 331 10*3/uL (ref 150–400)
RBC: 3.37 MIL/uL — ABNORMAL LOW (ref 3.87–5.11)
RDW: 19.3 % — AB (ref 11.5–15.5)
WBC: 14.1 10*3/uL — ABNORMAL HIGH (ref 4.0–10.5)

## 2017-07-15 LAB — COMPREHENSIVE METABOLIC PANEL
ALT: 14 U/L (ref 14–54)
AST: 43 U/L — AB (ref 15–41)
Albumin: 1.9 g/dL — ABNORMAL LOW (ref 3.5–5.0)
Alkaline Phosphatase: 152 U/L — ABNORMAL HIGH (ref 38–126)
Anion gap: 9 (ref 5–15)
BILIRUBIN TOTAL: 0.5 mg/dL (ref 0.3–1.2)
BUN: 21 mg/dL — AB (ref 6–20)
CHLORIDE: 107 mmol/L (ref 101–111)
CO2: 25 mmol/L (ref 22–32)
CREATININE: 0.68 mg/dL (ref 0.44–1.00)
Calcium: 8.7 mg/dL — ABNORMAL LOW (ref 8.9–10.3)
GFR calc Af Amer: >60 mL/min (ref >60)
Glucose, Bld: 128 mg/dL — ABNORMAL HIGH (ref 65–99)
POTASSIUM: 3.8 mmol/L (ref 3.5–5.1)
Sodium: 141 mmol/L (ref 135–145)
Total Protein: 5.8 g/dL — ABNORMAL LOW (ref 6.5–8.1)

## 2017-07-15 LAB — PROTIME-INR
INR: 1.45
Prothrombin Time: 17.5 seconds — ABNORMAL HIGH (ref 11.4–15.2)

## 2017-07-15 LAB — I-STAT CG4 LACTIC ACID, ED: LACTIC ACID, VENOUS: 3.42 mmol/L — AB (ref 0.5–1.9)

## 2017-07-15 LAB — GLUCOSE, CAPILLARY
GLUCOSE-CAPILLARY: 147 mg/dL — AB (ref 65–99)
GLUCOSE-CAPILLARY: 158 mg/dL — AB (ref 65–99)

## 2017-07-15 LAB — LACTIC ACID, PLASMA: Lactic Acid, Venous: 2.2 mmol/L (ref 0.5–1.9)

## 2017-07-15 LAB — PROCALCITONIN: Procalcitonin: 1.64 ng/mL

## 2017-07-15 LAB — MRSA PCR SCREENING: MRSA BY PCR: NEGATIVE

## 2017-07-15 MED ORDER — VANCOMYCIN HCL IN DEXTROSE 1-5 GM/200ML-% IV SOLN: 1000.0000 mg | Freq: Once | INTRAVENOUS | Status: DC

## 2017-07-15 MED ORDER — SODIUM CHLORIDE 0.9 % IV BOLUS (SEPSIS)
1000.0000 mL | Freq: Once | INTRAVENOUS | Status: AC
  Administered 2017-07-15: 1000 mL via INTRAVENOUS

## 2017-07-15 MED ORDER — POLYETHYL GLYCOL-PROPYL GLYCOL 0.4-0.3 % OP SOLN: Freq: Two times a day (BID) | OPHTHALMIC | Status: DC

## 2017-07-15 MED ORDER — VANCOMYCIN HCL IN DEXTROSE 750-5 MG/150ML-% IV SOLN
750.0000 mg | INTRAVENOUS | Status: DC
  Administered 2017-07-16 – 2017-07-17 (×2): 750 mg via INTRAVENOUS
  Filled 2017-07-15 (×3): qty 150

## 2017-07-15 MED ORDER — ENOXAPARIN SODIUM 80 MG/0.8ML ~~LOC~~ SOLN
70.0000 mg | Freq: Two times a day (BID) | SUBCUTANEOUS | Status: DC
  Administered 2017-07-15 – 2017-07-19 (×8): 70 mg via SUBCUTANEOUS
  Filled 2017-07-15 (×8): qty 0.7

## 2017-07-15 MED ORDER — VANCOMYCIN HCL IN DEXTROSE 1-5 GM/200ML-% IV SOLN
1000.0000 mg | Freq: Once | INTRAVENOUS | Status: AC
  Administered 2017-07-15: 1000 mg via INTRAVENOUS
  Filled 2017-07-15: qty 200

## 2017-07-15 MED ORDER — POLYVINYL ALCOHOL 1.4 % OP SOLN
1.0000 [drp] | Freq: Two times a day (BID) | OPHTHALMIC | Status: DC
  Administered 2017-07-15 – 2017-07-19 (×8): 1 [drp] via OPHTHALMIC
  Filled 2017-07-15: qty 15

## 2017-07-15 MED ORDER — INSULIN ASPART 100 UNIT/ML ~~LOC~~ SOLN
0.0000 [IU] | SUBCUTANEOUS | Status: DC
  Administered 2017-07-15 – 2017-07-16 (×2): 1 [IU] via SUBCUTANEOUS
  Administered 2017-07-16: 2 [IU] via SUBCUTANEOUS
  Administered 2017-07-19 (×2): 1 [IU] via SUBCUTANEOUS

## 2017-07-15 MED ORDER — DILTIAZEM HCL-DEXTROSE 100-5 MG/100ML-% IV SOLN (PREMIX)
5.0000 mg/h | INTRAVENOUS | Status: DC
  Administered 2017-07-15 – 2017-07-16 (×2): 5 mg/h via INTRAVENOUS
  Administered 2017-07-16: 10 mg/h via INTRAVENOUS
  Administered 2017-07-16: 15 mg/h via INTRAVENOUS
  Administered 2017-07-17 – 2017-07-18 (×2): 5 mg/h via INTRAVENOUS
  Administered 2017-07-18: 10 mg/h via INTRAVENOUS
  Filled 2017-07-15 (×7): qty 100

## 2017-07-15 MED ORDER — PIPERACILLIN-TAZOBACTAM 3.375 G IVPB 30 MIN
3.3750 g | Freq: Once | INTRAVENOUS | Status: AC
  Administered 2017-07-15: 3.375 g via INTRAVENOUS
  Filled 2017-07-15: qty 50

## 2017-07-15 MED ORDER — PIPERACILLIN-TAZOBACTAM 3.375 G IVPB
3.3750 g | Freq: Three times a day (TID) | INTRAVENOUS | Status: DC
  Administered 2017-07-15 – 2017-07-19 (×11): 3.375 g via INTRAVENOUS
  Filled 2017-07-15 (×10): qty 50

## 2017-07-15 MED ORDER — SODIUM CHLORIDE 0.9 % IV BOLUS (SEPSIS)
250.0000 mL | Freq: Once | INTRAVENOUS | Status: AC
  Administered 2017-07-15: 250 mL via INTRAVENOUS

## 2017-07-15 MED ORDER — SODIUM CHLORIDE 0.9 % IV SOLN
1000.0000 mL | INTRAVENOUS | Status: DC
  Administered 2017-07-15 (×2): 1000 mL via INTRAVENOUS

## 2017-07-15 NOTE — H&P (Signed)
History and Physical  [name] ZOX:[id number] DOB: 10/12/1925 DOA: 07/15/2017  Referring physician: EDP PCP: [name], [name], MD   Chief Complaint: Fever, cough  HPI: Sara LeventhalHazel Gross is a 84 y.o. female   History of hypertension, insulin-dependent type 2 diabetes, hypothyroidism, DVT s/p IVC filter, sent from nursing home [facility] ED due to above complaints. Patient has dementia, is confused ,not able to provide history.  History obtained from chart review, talking to EDP and daughter who is currently at bedside.  Per daughter patient had multiple hospitalizations on the second part of 2018.  She has been bedbound since August 2018.  Most of her previous medical care has been at [facility]. patient is most recently discharged to skilled nursing facility in [place] to be close to daughter who lives in [place].  She had a bone biopsy for sacral wound and has been on IV vanc for the last 5 weeks.  She has developed cough and fever, she is sent to ED for further evaluation.  ED course: T-max 100, she is found to be in A. fib RVR on arrival, respiration rate ranged from 20-30,  no hypoxia, blood pressure low normal.  Labs with leukocytosis WBC 14.  Creatinine 0.8, lactic acid 3.4.  UA concerning for UTI,  cxr "Patchy bilateral airspace opacities, greatest in the left lower lobe-suspicious for pneumonia. Radiographic follow-up to resolution recommended."  Urine and Blood culture obtained in the ED, given IV fluids vancomycin and Zosyn, hospitalist called to admit the patient.  Review of Systems:  Detail per HPI, Review of systems are otherwise negative  Past Medical History:  Diagnosis Date  . Aftercare following surgery of the skin or subcutaneous tissue 03/20/2017  . Alzheimer disease   . Chronic low back pain   . Closed fracture of right distal tibia   . Constipation   . Debility 03/20/2017  . Dementia without behavioral disturbance   . Diabetes  mellitus without complication (HCC)   . Frailty syndrome in geriatric patient   . Hypoalbuminemia 03/20/2017  . Hypothyroidism   . Mild cognitive impairment, so stated   . Osteoarthritis of multiple joints    with replacement surgeries  . Protein-calorie malnutrition, moderate (HCC) 03/20/2017  . Spinal stenosis at L4-L5 level   . Stage IV pressure ulcer of sacral region (HCC) 03/20/2017  . Total knee replacement status, right   . Type 2 diabetes mellitus (HCC)   . Unstageable pressure ulcer of right heel (HCC) 03/20/2017  . Urge incontinence    Past Surgical History:  Procedure Laterality Date  . distal tibia fracture repair Right   . TOTAL KNEE ARTHROPLASTY Right    Social History:  reports that  has never smoked. she has never used smokeless tobacco. She reports that she does not drink alcohol or use drugs. [facility] & bed bound with dementia  Allergies  Allergen Reactions  . Januvia [Sitagliptin]     transaminitis     History reviewed. No pertinent family history.    Prior to Admission medications   Medication Sig Start Date End Date Taking? Authorizing Provider  acetaminophen (TYLENOL) 325 MG tablet Take 650 mg by mouth every 6 (six) hours as needed for moderate pain.    Yes [provider]  Amino Acids-Protein Hydrolys (FEEDING SUPPLEMENT, PRO-STAT SUGAR FREE 64,) LIQD Take 30 mLs by mouth 2 (two) times daily.   Yes [provider]  amLODipine (NORVASC) 5 MG tablet Take 5 mg by mouth  daily. Hold for SBP less than 120   Yes [provider]  amoxicillin-clavulanate (AUGMENTIN) 875-125 MG tablet Take 1 tablet by mouth 2 (two) times daily. x7 days 07/15/17  Yes [provider]  aspirin EC 81 MG tablet Take 81 mg by mouth daily.   Yes [provider]  Carboxymethylcellulose Sodium (LUBRICANT EYE DROPS OP) Place 1 drop into both eyes 2 (two) times daily.   Yes [provider]  GLUCERNA (GLUCERNA) LIQD Take 237  mLs by mouth 2 (two) times daily between meals. due to weight loss   Yes [provider]  insulin lispro (HUMALOG KWIKPEN) 100 UNIT/ML KiwkPen Inject into the skin. Sliding scale 70-120 =0 units, 121-150= 2 units, 151-200= 3 units, 201-250=5 units, 251-300=8 units, 301-350=11 units, 351-400=15 units. Greater than 400 call MD and give 15 units.   Yes [provider]  levothyroxine (SYNTHROID, LEVOTHROID) 112 MCG tablet Take 112 mcg by mouth daily before breakfast.   Yes [provider]  NON FORMULARY Take 1 each by mouth 3 (three) times daily with meals. "Magic Cup"   Yes [provider]  nutrition supplement, JUVEN, (JUVEN) PACK Take 1 packet by mouth 2 (two) times daily between meals.   Yes [provider]  pantoprazole (PROTONIX) 40 MG tablet Take 40 mg by mouth daily.    Yes [provider]  polyethylene glycol (MIRALAX / GLYCOLAX) packet Take 17 g by mouth daily.    Yes [provider]  potassium chloride (K-DUR,KLOR-CON) 10 MEQ tablet Take 10 mEq by mouth daily.   Yes [provider]  traMADol (ULTRAM) 50 MG tablet Take 50 mg by mouth every 8 (eight) hours as needed for moderate pain. Take 30 mins before wound care daily    Yes [provider]  vancomycin 1,000 mg in sodium chloride 0.9 % 250 mL Inject 1,000 mg into the vein every 12 (twelve) hours. Vanco 1 gram/150 ml-0.9%NACL--Infuse 250 ml via PICC daily for Osteomyelitis 07/11/17  Yes [provider]  Vancomycin HCl in NaCl 1.25-0.9 GM/150ML-% SOLN Inject 1,250 mg into the vein daily.   Yes [provider]  vitamin C (ASCORBIC ACID) 500 MG tablet Take 500 mg by mouth 2 (two) times daily. promote wound healing   Yes [provider]    Physical Exam: BP 113/62 (BP Location: Left Arm)   Pulse (!) 113   Temp 100 F (37.8 C) (Rectal)   Resp (!) 23   Ht 5\' 5"  (1.651 m)   Wt 73.5 kg (162 lb)   SpO2 98%   BMI 26.96 kg/m   General:   Confused, malnorished Eyes: PERRL ENT: dry oral muconsa Neck: supple, no JVD Cardiovascular: IRRR Respiratory: crackles, no wheezing Abdomen: soft/ND/ND, positive bowel sounds Skin: no rash Musculoskeletal:  Left lower extremity pitting edema Psychiatric: confused Neurologic: confused          Labs on Admission:  Basic Metabolic Panel: Recent Labs  Lab 07/15/17 1352  NA 141  K 3.8  CL 107  CO2 25  GLUCOSE 128*  BUN 21*  CREATININE 0.68  CALCIUM 8.7*   Liver Function Tests: Recent Labs  Lab 07/15/17 1352  AST 43*  ALT 14  ALKPHOS 152*  BILITOT 0.5  PROT 5.8*  ALBUMIN 1.9*   No results for input(s): LIPASE, AMYLASE in the last 168 hours. No results for input(s): AMMONIA in the last 168 hours. CBC: Recent Labs  Lab 07/15/17 1352  WBC 14.1*  NEUTROABS 11.0*  HGB 8.7*  HCT 27.2*  MCV 80.7  PLT 331   Cardiac Enzymes: No results for input(s): CKTOTAL, CKMB, CKMBINDEX, TROPONINI in the last 168 hours.  BNP (last 3 results) No results for input(s): BNP in the last 8760 hours.  ProBNP (last 3 results) No results for input(s): PROBNP in the last 8760 hours.  CBG: No results for input(s): GLUCAP in the last 168 hours.  Radiological Exams on Admission: Dg Chest 2 View  Result Date: 07/15/2017 CLINICAL DATA:  Cough, congestion and fever for several days. EXAM: CHEST  2 VIEW COMPARISON:  None. FINDINGS: The cardiomediastinal silhouette is unremarkable. Patchy bilateral airspace opacities, greatest in the left lower lobe, are suspicious for pneumonia. There may be a trace left pleural effusion present. No dominant mass or pneumothorax noted. No acute bony abnormalities are identified. IMPRESSION: Patchy bilateral airspace opacities, greatest in the left lower lobe-suspicious for pneumonia. Radiographic follow-up to resolution recommended. Electronically Signed   By: Harmon Pier M.D.   On: 07/15/2017 15:19    EKG: Independently reviewed. RBBB, tachycardia, on tele  more consistent with afib/rvr  Assessment/Plan Present on Admission: **None**   Severe sepsis Sepsis presented on admission with fever, sinus tachycardia, tachypnea, lactic acidosis. Source of infection including pneumonia, UTI, She has been on Vanco for osteomyelitis in the sacral region, blood culture in process. Urine culture in process. Sepsis protocol in place, continue IV fluids IV Vanco and Zosyn.  Will opt culture. Wound care consulted.  N.p.o. for now, will get swallow eval, on aspiration protocol.   A. fib RVR New diagnosis, will start Cardizem drip Daughter reports patient was taken off anticoagulation due to nosebleed in the past CHADSvasc score is high, discussed with daughter she agrees to Lovenox for now.  Insulin-dependent type 2 diabetes Check A1c SSI for now, hypoglycemic protocol ,she is n.p.o.  Dementia, total care, bedbound, malnourished Poor prognosis Family is reasonable  DVT prophylaxis: on full dose lovenox  Consultants: none  Code Status: DNR  Family Communication:  Patient and daughter at bedside  Disposition Plan: admit to stepdown  Time spent:  Albertine Grates MD, PhD [facility] Pager [phone] If 7PM-7AM, please contact night-coverage at [email or web address], password [facility]

## 2017-07-15 NOTE — ED Triage Notes (Addendum)
Per EMS: Pt is coming from [facility]. Pt is tachy with 12 lead showing Afib with RVR. EMS reports that per facility this is new and she has no prior cardiac hx.  Pt's heartrate is 150's with her BP soft.  Pt's fever was 100.6 Tympanically Pt reports abdominal pain that is tender in all 4 quadrants.  Pt has hx of dementia.  Pt has a PICC line that was used for Vancomycin approximately 4 days ago for a bed sore. Pt also has a 20g in the LFA Pt has 500 of fluid with EMS

## 2017-07-15 NOTE — Progress Notes (Signed)
Per chart pt has dementia, CSW attempted to call pt's family and left VM's to complete assessment.   CSW awaiting return call from Pt's daughters.  Please reconsult if future social work needs arise.  CSW signing off, as social work intervention is no longer needed.  Dorothe PeaJonathan F. Cyerra Yim, LCSW, LCAS, CSI Clinical Social Worker Ph: [phone]

## 2017-07-15 NOTE — ED Notes (Signed)
Made MD aware of lactic acid level.

## 2017-07-15 NOTE — ED Provider Notes (Signed)
MSE was initiated and I personally evaluated the patient and placed orders (if any) at  2:50 PM on July 15, 2017.  The patient appears stable so that the remainder of the MSE may be completed by another provider.  Patient here with cough congestion and fever with increased heart rate times 1 day.  Also has chronic wound for which she is been treated with IV vancomycin.  Patient is elevated lactate noted.  Patient is a DNR.  patient started on IV fluids and antibiotics but any stable for evaluation by next provider   Lorre NickAllen, Agnieszka Newhouse, MD 07/15/17 1451

## 2017-07-15 NOTE — ED Notes (Signed)
Pt's daughter reports that she has had phlegm the last few days at the nursing home. Pt has been getting double protein at the facility to promote wound healing. Pt has swelling bilaterally in her arms.

## 2017-07-15 NOTE — Progress Notes (Addendum)
Pharmacy Antibiotic Note/Anticoagulation Note  Sara LeventhalHazel Gross is a 84 y.o. female admitted on 07/15/2017 with sepsis d/t suspected PNA.  Pharmacy has been consulted for vancomycin and zosyn dosing. Note patient was previously on Vancomycin x 10 days at nursing facility for treatment of sacral osteomyelitis (culture results not available in Epic at this time). Last day of vancomycin was 1/27 with intent to convert to Augmentin today, but patient unable to keep medication down.   Patient also noted to be in new Afib with RVR; pharmacy consulted to dose lovenox. Hgb low but appears to be at baseline; Plt WNL.  Plan:  Vancomycin 1000 mg IV now, then 750 mg IV q24 hr (est AUC 475 based on SCr rounded to 1.0)  Measure vancomycin AUC at steady state as indicated  Based on AUC calculations, some concern over accumulation with previous dose of 1g q12 x 10d. Dose already given today in ED. Will order VRm with AM labs tomorrow to ensure patient not toxic. Calculated VPk=29 and VT=13 for AUC 475 - if VRm significantly greater than calculated VPk, may want to hold doses while drug clears  Zosyn 3.375 g IV given once over 30 minutes, then every 8 hrs by 4-hr infusion  Lovenox 70 mg SQ q12 hr  F/u plans for long-term anticoagulation  Height: 5\' 5"  (165.1 cm) Weight: 162 lb (73.5 kg) IBW/kg (Calculated) : 57  Temp (24hrs), Avg:98.9 F (37.2 C), Min:98.2 F (36.8 C), Max:100 F (37.8 C)  Recent Labs  Lab 07/15/17 1352 07/15/17 1439  WBC 14.1*  --   CREATININE 0.68  --   LATICACIDVEN  --  3.42*    Estimated Creatinine Clearance: 47.9 mL/min (by C-G formula based on SCr of 0.68 mg/dL).    Allergies  Allergen Reactions  . Januvia [Sitagliptin]     transaminitis      Thank you for allowing pharmacy to be a part of this patient's care.  Bernadene Personrew Davontay Watlington, PharmD, BCPS [phone] 07/15/2017, 7:32 PM

## 2017-07-15 NOTE — Progress Notes (Addendum)
A consult was received from an ED physician for Vancomycin and Zosyn per pharmacy dosing.  The patient's profile has been reviewed for ht/wt/allergies/indication/available labs. A one time order has been placed for the above antibiotics.  Further antibiotics/pharmacy consults should be ordered by admitting physician if indicated.                       Thank you, Bernadene Personrew Anitta Tenny, PharmD, BCPS [phone] 07/15/2017, 3:28 PM

## 2017-07-15 NOTE — ED Notes (Signed)
Pt has a pressure ulcer on her bottom. Pt also has a pressure ulcer on her right elbow.

## 2017-07-15 NOTE — ED Notes (Signed)
Patient transported to X-ray 

## 2017-07-15 NOTE — ED Provider Notes (Signed)
[facility]-EMERGENCY DEPT Provider Note   CSN: [id number] Arrival date & time: 07/15/17  1344     History   Chief Complaint No chief complaint on file.   HPI Sara LeventhalHazel Gross is a 84 y.o. female. Level 5 caveat due to dementia. HPI Patient presents with daughter.  Comes from nursing home.  Reportedly has had infection.  Reportedly had an x-ray as an outpatient that showed pneumonia.  Has been doing worse the last few days.  Reportedly having difficulty eating.  Has been on IV vancomycin due to osteomyelitis of the sacral area.  Has had fevers up to 100.5.  Also found to be in new onset A. fib with RVR.  Patient is a DNR and would not want intubation. Past Medical History:  Diagnosis Date  . Aftercare following surgery of the skin or subcutaneous tissue 03/20/2017  . Alzheimer disease   . Chronic low back pain   . Closed fracture of right distal tibia   . Constipation   . Debility 03/20/2017  . Dementia without behavioral disturbance   . Diabetes mellitus without complication (HCC)   . Frailty syndrome in geriatric patient   . Hypoalbuminemia 03/20/2017  . Hypothyroidism   . Mild cognitive impairment, so stated   . Osteoarthritis of multiple joints    with replacement surgeries  . Protein-calorie malnutrition, moderate (HCC) 03/20/2017  . Spinal stenosis at L4-L5 level   . Stage IV pressure ulcer of sacral region (HCC) 03/20/2017  . Total knee replacement status, right   . Type 2 diabetes mellitus (HCC)   . Unstageable pressure ulcer of right heel (HCC) 03/20/2017  . Urge incontinence     Patient Active Problem List   Diagnosis Date Noted  . Decubitus ulcer of buttock, unstageable (HCC) 05/28/2017  . Sepsis due to Peptostreptococcus species (HCC) 05/28/2017  . Severe protein-calorie malnutrition (HCC) 05/28/2017  . Acute deep vein thrombosis (DVT) of lower extremity (HCC) 05/28/2017  . Hypertension 05/28/2017  . GERD (gastroesophageal reflux  disease) 05/28/2017  . Chronic anemia 05/28/2017  . Alzheimer disease   . Chronic low back pain   . Closed fracture of right distal tibia   . Constipation   . Dementia without behavioral disturbance   . Diabetes mellitus without complication (HCC)   . Frailty syndrome in geriatric patient   . Hypothyroidism   . Mild cognitive impairment, so stated   . Osteoarthritis of multiple joints   . Spinal stenosis at L4-L5 level   . Total knee replacement status, right   . Type 2 diabetes mellitus (HCC)   . Urge incontinence   . Aftercare following surgery of the skin or subcutaneous tissue 03/20/2017  . Debility 03/20/2017  . Hypoalbuminemia 03/20/2017  . Protein-calorie malnutrition, moderate (HCC) 03/20/2017  . Stage IV pressure ulcer of sacral region (HCC) 03/20/2017  . Unstageable pressure ulcer of right heel (HCC) 03/20/2017    Past Surgical History:  Procedure Laterality Date  . distal tibia fracture repair Right   . TOTAL KNEE ARTHROPLASTY Right     OB History    No data available       Home Medications    Prior to Admission medications   Medication Sig Start Date End Date Taking? Authorizing Provider  acetaminophen (TYLENOL) 325 MG tablet Take 650 mg by mouth every 6 (six) hours as needed for moderate pain.    Yes [provider]  Amino Acids-Protein Hydrolys (FEEDING SUPPLEMENT, PRO-STAT SUGAR FREE 64,) LIQD Take 30 mLs by mouth  2 (two) times daily.   Yes [provider]  amLODipine (NORVASC) 5 MG tablet Take 5 mg by mouth daily. Hold for SBP less than 120   Yes [provider]  amoxicillin-clavulanate (AUGMENTIN) 875-125 MG tablet Take 1 tablet by mouth 2 (two) times daily. x7 days 07/15/17  Yes [provider]  aspirin EC 81 MG tablet Take 81 mg by mouth daily.   Yes [provider]  Carboxymethylcellulose Sodium (LUBRICANT EYE DROPS OP) Place 1 drop into both eyes 2 (two) times daily.   Yes [provider]  GLUCERNA  (GLUCERNA) LIQD Take 237 mLs by mouth 2 (two) times daily between meals. due to weight loss   Yes [provider]  insulin lispro (HUMALOG KWIKPEN) 100 UNIT/ML KiwkPen Inject into the skin. Sliding scale 70-120 =0 units, 121-150= 2 units, 151-200= 3 units, 201-250=5 units, 251-300=8 units, 301-350=11 units, 351-400=15 units. Greater than 400 call MD and give 15 units.   Yes [provider]  levothyroxine (SYNTHROID, LEVOTHROID) 112 MCG tablet Take 112 mcg by mouth daily before breakfast.   Yes [provider]  NON FORMULARY Take 1 each by mouth 3 (three) times daily with meals. "Magic Cup"   Yes [provider]  nutrition supplement, JUVEN, (JUVEN) PACK Take 1 packet by mouth 2 (two) times daily between meals.   Yes [provider]  pantoprazole (PROTONIX) 40 MG tablet Take 40 mg by mouth daily.    Yes [provider]  polyethylene glycol (MIRALAX / GLYCOLAX) packet Take 17 g by mouth daily.    Yes [provider]  potassium chloride (K-DUR,KLOR-CON) 10 MEQ tablet Take 10 mEq by mouth daily.   Yes [provider]  traMADol (ULTRAM) 50 MG tablet Take 50 mg by mouth every 8 (eight) hours as needed for moderate pain. Take 30 mins before wound care daily    Yes [provider]  vancomycin 1,000 mg in sodium chloride 0.9 % 250 mL Inject 1,000 mg into the vein every 12 (twelve) hours. Vanco 1 gram/150 ml-0.9%NACL--Infuse 250 ml via PICC daily for Osteomyelitis 07/11/17  Yes [provider]  Vancomycin HCl in NaCl 1.25-0.9 GM/150ML-% SOLN Inject 1,250 mg into the vein daily.   Yes [provider]  vitamin C (ASCORBIC ACID) 500 MG tablet Take 500 mg by mouth 2 (two) times daily. promote wound healing   Yes [provider]    Family History History reviewed. No pertinent family history.  Social History Social History   Tobacco Use  . Smoking status: Never Smoker  . Smokeless tobacco: Never Used    Substance Use Topics  . Alcohol use: No    Frequency: Never  . Drug use: No     Allergies   Januvia [sitagliptin]   Review of Systems Review of Systems  Unable to perform ROS: Dementia     Physical Exam Updated Vital Signs BP 113/62 (BP Location: Left Arm)   Pulse (!) 113   Temp 100 F (37.8 C) (Rectal)   Resp (!) 23   Ht 5\' 5"  (1.651 m)   Wt 73.5 kg (162 lb)   SpO2 98%   BMI 26.96 kg/m   Physical Exam  Constitutional: She appears well-developed.  HENT:  Head: Atraumatic.  Eyes: Pupils are equal, round, and reactive to light.  Neck: Neck supple.  Cardiovascular:  Tachycardia  Pulmonary/Chest: She has no wheezes.  Mildly harsh breath sounds throughout.  Abdominal: There is tenderness.  Mild tenderness with right lower  quadrant fullness.  Musculoskeletal: She exhibits edema.  Edema of right upper extremity.  Some edema bilateral lower extremities.  Wound on right lateral lower leg.  No purulence.  Large sacral decubitus ulcer.  Neurological:  Able to recognize her daughter but I am really unable to understand her.  Skin: Skin is warm. Capillary refill takes less than 2 seconds.     ED Treatments / Results  Labs (all labs ordered are listed, but only abnormal results are displayed) Labs Reviewed  COMPREHENSIVE METABOLIC PANEL - Abnormal; Notable for the following components:      Result Value   Glucose, Bld 128 (*)    BUN 21 (*)    Calcium 8.7 (*)    Total Protein 5.8 (*)    Albumin 1.9 (*)    AST 43 (*)    Alkaline Phosphatase 152 (*)    All other components within normal limits  CBC WITH DIFFERENTIAL/PLATELET - Abnormal; Notable for the following components:   WBC 14.1 (*)    RBC 3.37 (*)    Hemoglobin 8.7 (*)    HCT 27.2 (*)    MCH 25.8 (*)    RDW 19.3 (*)    Neutro Abs 11.0 (*)    All other components within normal limits  URINALYSIS, ROUTINE W REFLEX MICROSCOPIC - Abnormal; Notable for the following components:   Color, Urine AMBER (*)     APPearance CLOUDY (*)    Glucose, UA 50 (*)    Hgb urine dipstick SMALL (*)    Ketones, ur 5 (*)    Protein, ur 30 (*)    Nitrite POSITIVE (*)    Leukocytes, UA LARGE (*)    Bacteria, UA RARE (*)    Squamous Epithelial / LPF 0-5 (*)    All other components within normal limits  I-STAT CG4 LACTIC ACID, ED - Abnormal; Notable for the following components:   Lactic Acid, Venous 3.42 (*)    All other components within normal limits  CULTURE, BLOOD (ROUTINE X 2)  CULTURE, BLOOD (ROUTINE X 2)  URINE CULTURE  PROTIME-INR  I-STAT CG4 LACTIC ACID, ED    EKG  EKG Interpretation  Date/Time:  Monday July 15 2017 14:00:35 EST Ventricular Rate:  155 PR Interval:    QRS Duration: 136 QT Interval:  342 QTC Calculation: 550 R Axis:   -4 Text Interpretation:  regular tachcardia Right bundle branch block Confirmed by Benjiman Core [phone]) on 07/15/2017 3:10:26 PM       Radiology Dg Chest 2 View  Result Date: 07/15/2017 CLINICAL DATA:  Cough, congestion and fever for several days. EXAM: CHEST  2 VIEW COMPARISON:  None. FINDINGS: The cardiomediastinal silhouette is unremarkable. Patchy bilateral airspace opacities, greatest in the left lower lobe, are suspicious for pneumonia. There may be a trace left pleural effusion present. No dominant mass or pneumothorax noted. No acute bony abnormalities are identified. IMPRESSION: Patchy bilateral airspace opacities, greatest in the left lower lobe-suspicious for pneumonia. Radiographic follow-up to resolution recommended. Electronically Signed   By: Harmon Pier M.D.   On: 07/15/2017 15:19    Procedures Procedures (including critical care time)  Medications Ordered in ED Medications  sodium chloride 0.9 % bolus 1,000 mL (0 mLs Intravenous Stopped 07/15/17 1558)    And  sodium chloride 0.9 % bolus 1,000 mL (not administered)    And  sodium chloride 0.9 % bolus 250 mL (not administered)  0.9 %  sodium chloride infusion (1,000 mLs Intravenous  New Bag/Given 07/15/17 1527)  piperacillin-tazobactam (ZOSYN) IVPB 3.375 g (0 g Intravenous Stopped 07/15/17 1544)     Initial Impression / Assessment and Plan / ED Course  I have reviewed the triage vital signs and the nursing notes.  Pertinent labs & imaging results that were available during my care of the patient were reviewed by me and considered in my medical decision making (see chart for details).     Patient with fever and mental status changes.  New onset A. fib with RVR.  Chads vas score of 5 for age, female, hypertension, and diabetes.  However she is not an anticoagulation candidate due to previous nosebleeds on anticoagulation for her DVT.  Has a Greenfield filter in place.  Has been on vancomycin for pneumonia and for decubitus ulcers through PICC line.  Has been doing worse.  Reportedly has been having difficulty eating also.  Large decubitus ulcer that has had positive infection in the bone.  Lactic acid is elevated but on the floor.  Patient is not hypotensive.  Heart rate was initially A. fib with RVR but rate is now gone down to much close to 100.  Has had IV fluids.  Will admit to hospitalist.  Discussed with the patient's daughter and the patient continues to be a DNR.  CRITICAL CARE Performed by: Benjiman Core Total critical care time: 30 minutes Critical care time was exclusive of separately billable procedures and treating other patients. Critical care was necessary to treat or prevent imminent or life-threatening deterioration. Critical care was time spent personally by me on the following activities: development of treatment plan with patient and/or surrogate as well as nursing, discussions with consultants, evaluation of patient's response to treatment, examination of patient, obtaining history from patient or surrogate, ordering and performing treatments and interventions, ordering and review of laboratory studies, ordering and review of radiographic studies, pulse  oximetry and re-evaluation of patient's condition.   Final Clinical Impressions(s) / ED Diagnoses   Final diagnoses:  HCAP (healthcare-associated pneumonia)  Atrial fibrillation with rapid ventricular response (HCC)  Pressure injury of sacral region, stage 4 [facility])    ED Discharge Orders    None       Benjiman Core, MD 07/17/17 [phone]

## 2017-07-15 NOTE — Progress Notes (Addendum)
Consult request has been received. CSW attempting to follow up at present time.  Per chart, pt is from [facility].  If pt returns tonight 1/28,  pt will be returning to [facility], room 323 and number for report [phone].  If pt is admitted CSW will leave handoff for CSW assigned to pt.  CSW will update RN.  Dorothe PeaJonathan F. Jayli Fogleman, LCSW, LCAS, CSI Clinical Social Worker Ph: [phone]

## 2017-07-15 NOTE — ED Notes (Signed)
Below order not completed by EW. 

## 2017-07-15 NOTE — ED Notes (Signed)
Bed: WA08 Expected date:  Expected time:  Means of arrival:  Comments: 84 yo f fever

## 2017-07-15 NOTE — ED Notes (Signed)
ED TO INPATIENT HANDOFF REPORT  Name/Age/Gender Sara Gross 84 y.o. female  Code Status Code Status History    This patient does not have a recorded code status. Please follow your organizational policy for patients in this situation.    Advance Directive Documentation     Most Recent Value  Type of Advance Directive  Healthcare Power of Attorney, Living will, Out of facility DNR (pink MOST or yellow form)  Pre-existing out of facility DNR order (yellow form or pink MOST form)  No data  "MOST" Form in Place?  No data      Home/SNF/Other Nursing Home  Chief Complaint Fever  Level of Care/Admitting Diagnosis ED Disposition    ED Disposition Condition Comment   Admit  Hospital Area: [facility] [[id number]]  Level of Care: Stepdown [14]  Admit to SDU based on following criteria: Severe physiological/psychological symptoms:  Any diagnosis requiring assessment & intervention at least every 4 hours on an ongoing basis to obtain desired patient outcomes including stability and rehabilitation  Diagnosis: Sepsis [facility]) [[id number]]  Admitting Physician: Florencia Reasons [[id number]]  Attending Physician: Florencia Reasons [[id number]]  Estimated length of stay: past midnight tomorrow  Certification:: I certify this patient will need inpatient services for at least 2 midnights  PT Class (Do Not Modify): Inpatient [101]  PT Acc Code (Do Not Modify): Private [1]       Medical History Past Medical History:  Diagnosis Date  . Aftercare following surgery of the skin or subcutaneous tissue 03/20/2017  . Alzheimer disease   . Chronic low back pain   . Closed fracture of right distal tibia   . Constipation   . Debility 03/20/2017  . Dementia without behavioral disturbance   . Diabetes mellitus without complication ([place])   . Frailty syndrome in geriatric patient   . Hypoalbuminemia 03/20/2017  . Hypothyroidism   . Mild cognitive impairment, so stated   . Osteoarthritis of multiple joints     with replacement surgeries  . Protein-calorie malnutrition, moderate ([place]) 03/20/2017  . Spinal stenosis at L4-L5 level   . Stage IV pressure ulcer of sacral region ([place]) 03/20/2017  . Total knee replacement status, right   . Type 2 diabetes mellitus ([place])   . Unstageable pressure ulcer of right heel ([place]) 03/20/2017  . Urge incontinence     Allergies Allergies  Allergen Reactions  . Januvia [Sitagliptin]     transaminitis     IV Location/Drains/Wounds Patient Lines/Drains/Airways Status   Active Line/Drains/Airways    None          Labs/Imaging Results for orders placed or performed during the hospital encounter of 07/15/17 (from the past 48 hour(s))  Comprehensive metabolic panel     Status: Abnormal   Collection Time: 07/15/17  1:52 PM  Result Value Ref Range   Sodium 141 135 - 145 mmol/L   Potassium 3.8 3.5 - 5.1 mmol/L   Chloride 107 101 - 111 mmol/L   CO2 25 22 - 32 mmol/L   Glucose, Bld 128 (H) 65 - 99 mg/dL   BUN 21 (H) 6 - 20 mg/dL   Creatinine, Ser 0.68 0.44 - 1.00 mg/dL   Calcium 8.7 (L) 8.9 - 10.3 mg/dL   Total Protein 5.8 (L) 6.5 - 8.1 g/dL   Albumin 1.9 (L) 3.5 - 5.0 g/dL   AST 43 (H) 15 - 41 U/L   ALT 14 14 - 54 U/L   Alkaline Phosphatase 152 (H) 38 - 126 U/L   Total  Bilirubin 0.5 0.3 - 1.2 mg/dL   GFR calc non Af Amer >60 >60 mL/min   GFR calc Af Amer >60 >60 mL/min    Comment: (NOTE) The eGFR has been calculated using the CKD EPI equation. This calculation has not been validated in all clinical situations. eGFR's persistently <60 mL/min signify possible Chronic Kidney Disease.    Anion gap 9 5 - 15  CBC with Differential     Status: Abnormal   Collection Time: 07/15/17  1:52 PM  Result Value Ref Range   WBC 14.1 (H) 4.0 - 10.5 K/uL   RBC 3.37 (L) 3.87 - 5.11 MIL/uL   Hemoglobin 8.7 (L) 12.0 - 15.0 g/dL   HCT 27.2 (L) 36.0 - 46.0 %   MCV 80.7 78.0 - 100.0 fL   MCH 25.8 (L) 26.0 - 34.0 pg   MCHC 32.0 30.0 - 36.0 g/dL   RDW 19.3 (H)  11.5 - 15.5 %   Platelets 331 150 - 400 K/uL   Neutrophils Relative % 79 %   Neutro Abs 11.0 (H) 1.7 - 7.7 K/uL   Lymphocytes Relative 15 %   Lymphs Abs 2.1 0.7 - 4.0 K/uL   Monocytes Relative 6 %   Monocytes Absolute 0.9 0.1 - 1.0 K/uL   Eosinophils Relative 0 %   Eosinophils Absolute 0.0 0.0 - 0.7 K/uL   Basophils Relative 0 %   Basophils Absolute 0.0 0.0 - 0.1 K/uL  Urinalysis, Routine w reflex microscopic     Status: Abnormal   Collection Time: 07/15/17  2:11 PM  Result Value Ref Range   Color, Urine AMBER (A) YELLOW    Comment: BIOCHEMICALS MAY BE AFFECTED BY COLOR   APPearance CLOUDY (A) CLEAR   Specific Gravity, Urine 1.021 1.005 - 1.030   pH 5.0 5.0 - 8.0   Glucose, UA 50 (A) NEGATIVE mg/dL   Hgb urine dipstick SMALL (A) NEGATIVE   Bilirubin Urine NEGATIVE NEGATIVE   Ketones, ur 5 (A) NEGATIVE mg/dL   Protein, ur 30 (A) NEGATIVE mg/dL   Nitrite POSITIVE (A) NEGATIVE   Leukocytes, UA LARGE (A) NEGATIVE   RBC / HPF 6-30 0 - 5 RBC/hpf   WBC, UA TOO NUMEROUS TO COUNT 0 - 5 WBC/hpf   Bacteria, UA RARE (A) NONE SEEN   Squamous Epithelial / LPF 0-5 (A) NONE SEEN   Renal Epithelial 39    WBC Clumps PRESENT    Mucus PRESENT    Budding Yeast PRESENT   I-Stat CG4 Lactic Acid, ED     Status: Abnormal   Collection Time: 07/15/17  2:39 PM  Result Value Ref Range   Lactic Acid, Venous 3.42 (HH) 0.5 - 1.9 mmol/L   Comment NOTIFIED PHYSICIAN    Dg Chest 2 View  Result Date: 07/15/2017 CLINICAL DATA:  Cough, congestion and fever for several days. EXAM: CHEST  2 VIEW COMPARISON:  None. FINDINGS: The cardiomediastinal silhouette is unremarkable. Patchy bilateral airspace opacities, greatest in the left lower lobe, are suspicious for pneumonia. There may be a trace left pleural effusion present. No dominant mass or pneumothorax noted. No acute bony abnormalities are identified. IMPRESSION: Patchy bilateral airspace opacities, greatest in the left lower lobe-suspicious for pneumonia.  Radiographic follow-up to resolution recommended. Electronically Signed   By: Margarette Canada M.D.   On: 07/15/2017 15:19    Pending Labs Unresulted Labs (From admission, onward)   Start     Ordered   07/15/17 1430  Protime-INR  Once,   R  07/15/17 1430   07/15/17 1352  Culture, blood (Routine x 2)  BLOOD CULTURE X 2,   STAT     07/15/17 1352   07/15/17 1352  Urine culture  STAT,   STAT     07/15/17 1352      Vitals/Pain Today's Vitals   07/15/17 1358 07/15/17 1430 07/15/17 1544  BP:  (!) 117/97 113/62  Pulse:  (!) 153 (!) 113  Resp:  (!) 33 (!) 23  Temp:  100 F (37.8 C)   TempSrc:  Rectal   SpO2:  98% 98%  Weight: 162 lb (73.5 kg)    Height: _0  (1.651 m)      Isolation Precautions No active isolations  Medications Medications  sodium chloride 0.9 % bolus 1,000 mL (0 mLs Intravenous Stopped 07/15/17 1558)    And  sodium chloride 0.9 % bolus 1,000 mL (1,000 mLs Intravenous New Bag/Given 07/15/17 1654)    And  sodium chloride 0.9 % bolus 250 mL (250 mLs Intravenous New Bag/Given 07/15/17 1654)  0.9 %  sodium chloride infusion (1,000 mLs Intravenous New Bag/Given 07/15/17 1527)  vancomycin (VANCOCIN) IVPB 1000 mg/200 mL premix (1,000 mg Intravenous New Bag/Given 07/15/17 1655)  piperacillin-tazobactam (ZOSYN) IVPB 3.375 g (0 g Intravenous Stopped 07/15/17 1544)    Mobility non-ambulatory

## 2017-07-15 NOTE — Progress Notes (Signed)
Location:      Place of Service:     [name], [name], MD  Patient Care Team: [name], [name], MD as PCP - General (Internal Medicine)  Extended Emergency Contact Information [facility] Mobile Phone: [phone] Relation: Daughter Secondary Emergency Contact: Lewis,Tammy Mobile Phone: [phone] Relation: Other    Allergies: Januvia [sitagliptin]  Chief Complaint  Patient presents with  . Acute Visit    HPI: Patient is 84 y.o. female  with dementia, diabetes mellitus type 2, osteoarthritis, hypothyroidism, who [facility] with an infected sacral pressure injury on 03/20/2017. She was [place] to the hospital in [place]11[place]30 where she went underwent surgical debridement and placement of wound VAC for his stage IV infected sacral pressure ulcer. She also had Peptostreptococcus species bacteremia for which she was treated with IV Zosyn and additional 14 days of Augmentin plan for after discharge. Patient is nutritional standards were exceedingly poor and conversations were had about placement of PEG tube patient's family declined. Patient developed an acute DVT of the left lower extremity treated with Xarelto was started which caused nosebleeds so patient was placed on aspirin and had an inferior vena cava filter placed. Patient was admitted to skilled nursing facility where 5 weeks ago she had a bone biopsy done her sacral wound by wound care which was positive for osteomyelitis and patient has been treated with vancomycin for the past 5 weeks. Patient's initial albumin of 1 increased to 2.5 in December and is now decreased back to 1.8 as in the last 3 weeks or so patient has begun failing to thrive again. Patient's hemoglobin also steadily dropping from 9.6 in December to 8.2 several days ago. Before patient was admitted in October pt  had had an admission for failure to thrive as well.  I was asked see patient  today for reported cough over the weekend. Patient had been noted last week to cough with eating and speech therapy have been consulted. Today patient was requiring O2 to keep her saturations in the 90s. On initial consultation began on phone and chest x-ray was ordered. Chest x-ray came back to the left lower lobe infiltrate. At the bedside visit with the patient it was noted that her heart rate was 150, O2 saturation on 4 L waxed and waned between mid 80s to 95%. She is still talking and asking for something to drink but was unable to keep anything down, it regurges back up almost immediately.  Past Medical History:  Diagnosis Date  . Aftercare following surgery of the skin or subcutaneous tissue 03/20/2017  . Alzheimer disease   . Chronic low back pain   . Closed fracture of right distal tibia   . Constipation   . Debility 03/20/2017  . Dementia without behavioral disturbance   . Diabetes mellitus without complication (HCC)   . Frailty syndrome in geriatric patient   . Hypoalbuminemia 03/20/2017  . Hypothyroidism   . Mild cognitive impairment, so stated   . Osteoarthritis of multiple joints    with replacement surgeries  . Protein-calorie malnutrition, moderate (HCC) 03/20/2017  . Spinal stenosis at L4-L5 level   . Stage IV pressure ulcer of sacral region (HCC) 03/20/2017  . Total knee replacement status, right   . Type 2 diabetes mellitus (HCC)   . Unstageable pressure ulcer of right heel (HCC) 03/20/2017  . Urge incontinence     Past Surgical History:  Procedure Laterality Date  . distal tibia fracture repair  Right   . TOTAL KNEE ARTHROPLASTY Right     Allergies as of 07/15/2017      Reactions   Januvia [sitagliptin]    transaminitis      Medication List        Accurate as of 07/15/17  1:46 PM. Always use your most recent med list.          amLODipine 5 MG tablet Commonly known as:  NORVASC Take 5 mg by mouth daily. Hold for SBP less than 120   aspirin EC 81 MG  tablet Take 81 mg by mouth daily.   feeding supplement (PRO-STAT SUGAR FREE 64) Liqd Take 30 mLs by mouth 2 (two) times daily.   GLUCERNA Liqd Take 237 mLs by mouth 2 (two) times daily between meals. due to weight loss   nutrition supplement (JUVEN) Pack Take 1 packet by mouth 2 (two) times daily between meals.   HUMALOG KWIKPEN 100 UNIT/ML KiwkPen Generic drug:  insulin lispro Inject into the skin. Sliding scale 70-120 =0 units, 121-150= 2 units, 151-200= 3 units, 201-250=5 units, 251-300=8 units, 301-350=11 units, 351-400=15 units. Greater than 400 call MD and give 15 units.   levothyroxine 112 MCG tablet Commonly known as:  SYNTHROID, LEVOTHROID Take 112 mcg by mouth daily before breakfast.   pantoprazole 40 MG tablet Commonly known as:  PROTONIX Take 40 mg by mouth daily.   polyethylene glycol packet Commonly known as:  MIRALAX / GLYCOLAX Take 17 g by mouth daily.   potassium chloride 10 MEQ tablet Commonly known as:  K-DUR,KLOR-CON Take 10 mEq by mouth daily.   traMADol 50 MG tablet Commonly known as:  ULTRAM Take 50 mg by mouth See admin instructions. Take 30 mins before wound care daily   vitamin C 500 MG tablet Commonly known as:  ASCORBIC ACID Take 500 mg by mouth 2 (two) times daily. promote wound healing       No orders of the defined types were placed in this encounter.   Immunization History  Administered Date(s) Administered  . Influenza-Unspecified 04/18/2017  . PPD Test 05/17/2017    Social History   Tobacco Use  . Smoking status: Never Smoker  . Smokeless tobacco: Never Used  Substance Use Topics  . Alcohol use: No    Frequency: Never    Review of Systems  unable to obtain from patient secondary to dementia; nursing-per history of present illness; later daughter arrived and noted the patient had had increased mucus over the weekend.   Vitals:   07/15/17 1340  BP: 100/63  Pulse: (!) 136  Resp: 20  Temp: 98.2 F (36.8 C)  SpO2:  93%   There is no height or weight on file to calculate BMI. Physical Exam  GENERAL APPEARANCE: Alert, moderately conversant, looks uncomfortable. SKIN: No diaphoresis rash HEENT: Unremarkable RESPIRATORY: Breathing is even, unlabored. Lung sounds are rhonchi ; bedside pulse ox reading on 4 L nasal cannula is 95% with heart rate of 150  CARDIOVASCULAR: Heart RRR . Very Rapid  2/6  Murmur, no rubs or gallops. No peripheral edema  GASTROINTESTINAL: Abdomen is soft, non-tender, not distended w/ normal bowel sounds.  GENITOURINARY: Bladder non tender, not distended  MUSCULOSKELETAL: No abnormal joints or musculature NEUROLOGIC: Cranial nerves 2-12 grossly intact. Moves all extremities PSYCHIATRIC: Mood and affect with dementia, no behavioral issues  Patient Active Problem List   Diagnosis Date Noted  . Decubitus ulcer of buttock, unstageable (HCC) 05/28/2017  . Sepsis due to Peptostreptococcus species (HCC) 05/28/2017  .  Severe protein-calorie malnutrition (HCC) 05/28/2017  . Acute deep vein thrombosis (DVT) of lower extremity (HCC) 05/28/2017  . Hypertension 05/28/2017  . GERD (gastroesophageal reflux disease) 05/28/2017  . Chronic anemia 05/28/2017  . Alzheimer disease   . Chronic low back pain   . Closed fracture of right distal tibia   . Constipation   . Dementia without behavioral disturbance   . Diabetes mellitus without complication (HCC)   . Frailty syndrome in geriatric patient   . Hypothyroidism   . Mild cognitive impairment, so stated   . Osteoarthritis of multiple joints   . Spinal stenosis at L4-L5 level   . Total knee replacement status, right   . Type 2 diabetes mellitus (HCC)   . Urge incontinence   . Aftercare following surgery of the skin or subcutaneous tissue 03/20/2017  . Debility 03/20/2017  . Hypoalbuminemia 03/20/2017  . Protein-calorie malnutrition, moderate (HCC) 03/20/2017  . Stage IV pressure ulcer of sacral region (HCC) 03/20/2017  . Unstageable  pressure ulcer of right heel (HCC) 03/20/2017    CMP     Component Value Date/Time   NA 140 07/08/2017   NA 141 07/02/2017   K 4.3 07/08/2017   K 4.2 07/02/2017   BUN 12 07/08/2017   CREATININE 0.5 07/08/2017   CREATININE 0.39 07/02/2017   CALCIUM 8.3 07/02/2017   PROT 4.8 07/02/2017   ALBUMIN 2.2 07/02/2017   AST 23 07/08/2017   AST 19 07/02/2017   ALT 9 07/08/2017   ALT 9 07/02/2017   ALKPHOS 164 (A) 07/08/2017   ALKPHOS 153 07/02/2017   BILITOT 0.3 07/02/2017   Recent Labs    06/24/17 07/02/17 07/08/17  NA 142 141  141 140  K 3.2* 4.2  4.2 4.3  BUN 13 12 12   CREATININE 0.3* 0.4*  0.39 0.5  CALCIUM  --  8.3  --    Recent Labs    07/02/17 07/08/17  AST 19  19 23   ALT 9  9 9   ALKPHOS 153*  153 164*  BILITOT 0.3  --   PROT 4.8  --   ALBUMIN 2.2  --    Recent Labs    06/24/17 07/02/17 07/08/17  WBC 8.1 9.8  9.8 9.8  HGB 8.7* 8.5*  8.5 8.2*  HCT 27* 26*  25.8 26*  PLT 311 339 348   No results for input(s): CHOL, LDLCALC, TRIG in the last 8760 hours.  Invalid input(s): HCL No results found for: MICROALBUR No results found for: TSH No results found for: HGBA1C No results found for: CHOL, HDL, LDLCALC, LDLDIRECT, TRIG, CHOLHDL  Significant Diagnostic Results in last 30 days:  No results found.  Assessment and Plan  Chest x-ray with pneumonia/oxygen desaturation/heart rate 150, probable atrial fib with RVR-patient's trajectory is overall decline; I had a long conversation with the daughter last week and another one today about the poor health of her mother; having said that, probable pneumonia and probable atrial fibrillation with RVR are treatable and daughter and patient desire to go to the hospital.    Merrilee Seashore, MD

## 2017-07-16 ENCOUNTER — Other Ambulatory Visit: Payer: Self-pay

## 2017-07-16 DIAGNOSIS — E876 Hypokalemia: Secondary | ICD-10-CM

## 2017-07-16 DIAGNOSIS — J181 Lobar pneumonia, unspecified organism: Secondary | ICD-10-CM

## 2017-07-16 DIAGNOSIS — R627 Adult failure to thrive: Secondary | ICD-10-CM

## 2017-07-16 DIAGNOSIS — R1319 Other dysphagia: Secondary | ICD-10-CM

## 2017-07-16 LAB — COMPREHENSIVE METABOLIC PANEL
ALT: 13 U/L — ABNORMAL LOW (ref 14–54)
AST: 25 U/L (ref 15–41)
Albumin: 1.8 g/dL — ABNORMAL LOW (ref 3.5–5.0)
Alkaline Phosphatase: 126 U/L (ref 38–126)
Anion gap: 8 (ref 5–15)
BILIRUBIN TOTAL: 0.9 mg/dL (ref 0.3–1.2)
BUN: 16 mg/dL (ref 6–20)
CO2: 24 mmol/L (ref 22–32)
Calcium: 8.3 mg/dL — ABNORMAL LOW (ref 8.9–10.3)
Chloride: 109 mmol/L (ref 101–111)
Creatinine, Ser: 0.56 mg/dL (ref 0.44–1.00)
Glucose, Bld: 110 mg/dL — ABNORMAL HIGH (ref 65–99)
POTASSIUM: 3.1 mmol/L — AB (ref 3.5–5.1)
Sodium: 141 mmol/L (ref 135–145)
TOTAL PROTEIN: 5.4 g/dL — AB (ref 6.5–8.1)

## 2017-07-16 LAB — CBC WITH DIFFERENTIAL/PLATELET
Basophils Absolute: 0 10*3/uL (ref 0.0–0.1)
Basophils Relative: 0 %
EOS PCT: 1 %
Eosinophils Absolute: 0.1 10*3/uL (ref 0.0–0.7)
HCT: 25.5 % — ABNORMAL LOW (ref 36.0–46.0)
Hemoglobin: 8.3 g/dL — ABNORMAL LOW (ref 12.0–15.0)
LYMPHS ABS: 1.3 10*3/uL (ref 0.7–4.0)
LYMPHS PCT: 11 %
MCH: 26 pg (ref 26.0–34.0)
MCHC: 32.5 g/dL (ref 30.0–36.0)
MCV: 79.9 fL (ref 78.0–100.0)
MONO ABS: 0.9 10*3/uL (ref 0.1–1.0)
MONOS PCT: 7 %
Neutro Abs: 10.1 10*3/uL — ABNORMAL HIGH (ref 1.7–7.7)
Neutrophils Relative %: 81 %
Platelets: 371 10*3/uL (ref 150–400)
RBC: 3.19 MIL/uL — ABNORMAL LOW (ref 3.87–5.11)
RDW: 19.2 % — AB (ref 11.5–15.5)
WBC: 12.5 10*3/uL — ABNORMAL HIGH (ref 4.0–10.5)

## 2017-07-16 LAB — CREATININE, SERUM
Creatinine, Ser: 0.57 mg/dL (ref 0.44–1.00)
GFR calc non Af Amer: >60 mL/min (ref >60)

## 2017-07-16 LAB — HEMOGLOBIN A1C
Hgb A1c MFr Bld: 6.4 % — ABNORMAL HIGH (ref 4.8–5.6)
MEAN PLASMA GLUCOSE: 136.98 mg/dL

## 2017-07-16 LAB — URINE CULTURE

## 2017-07-16 LAB — GLUCOSE, CAPILLARY
GLUCOSE-CAPILLARY: 110 mg/dL — AB (ref 65–99)
GLUCOSE-CAPILLARY: 107 mg/dL — AB (ref 65–99)
Glucose-Capillary: 133 mg/dL — ABNORMAL HIGH (ref 65–99)
Glucose-Capillary: 102 mg/dL — ABNORMAL HIGH (ref 65–99)
Glucose-Capillary: 111 mg/dL — ABNORMAL HIGH (ref 65–99)
Glucose-Capillary: 116 mg/dL — ABNORMAL HIGH (ref 65–99)

## 2017-07-16 LAB — VANCOMYCIN, RANDOM: Vancomycin Rm: 22

## 2017-07-16 LAB — MAGNESIUM: MAGNESIUM: 1.7 mg/dL (ref 1.7–2.4)

## 2017-07-16 LAB — LACTIC ACID, PLASMA: LACTIC ACID, VENOUS: 1.6 mmol/L (ref 0.5–1.9)

## 2017-07-16 MED ORDER — SODIUM CHLORIDE 0.9% FLUSH: 10.0000 mL | INTRAVENOUS | Status: DC | PRN

## 2017-07-16 MED ORDER — ALTEPLASE 2 MG IJ SOLR
2.0000 mg | Freq: Once | INTRAMUSCULAR | Status: AC
  Administered 2017-07-16: 2 mg
  Filled 2017-07-16: qty 2

## 2017-07-16 MED ORDER — POTASSIUM CHLORIDE 10 MEQ/100ML IV SOLN
10.0000 meq | INTRAVENOUS | Status: AC
  Administered 2017-07-16 (×4): 10 meq via INTRAVENOUS
  Filled 2017-07-16 (×4): qty 100

## 2017-07-16 MED ORDER — CHLORHEXIDINE GLUCONATE CLOTH 2 % EX PADS
6.0000 | MEDICATED_PAD | Freq: Every day | CUTANEOUS | Status: DC
  Administered 2017-07-16 – 2017-07-18 (×3): 6 via TOPICAL

## 2017-07-16 MED ORDER — KETOROLAC TROMETHAMINE 15 MG/ML IJ SOLN
7.5000 mg | Freq: Three times a day (TID) | INTRAMUSCULAR | Status: AC | PRN
  Administered 2017-07-16 – 2017-07-18 (×3): 7.5 mg via INTRAVENOUS
  Filled 2017-07-16 (×3): qty 1

## 2017-07-16 MED ORDER — CHLORHEXIDINE GLUCONATE 0.12 % MT SOLN
15.0000 mL | Freq: Two times a day (BID) | OROMUCOSAL | Status: DC
  Administered 2017-07-17 – 2017-07-19 (×5): 15 mL via OROMUCOSAL
  Filled 2017-07-16 (×4): qty 15

## 2017-07-16 MED ORDER — ORAL CARE MOUTH RINSE
15.0000 mL | Freq: Two times a day (BID) | OROMUCOSAL | Status: DC
  Administered 2017-07-16 – 2017-07-18 (×4): 15 mL via OROMUCOSAL

## 2017-07-16 MED ORDER — MAGNESIUM SULFATE 2 GM/50ML IV SOLN
2.0000 g | Freq: Once | INTRAVENOUS | Status: AC
  Administered 2017-07-16: 2 g via INTRAVENOUS
  Filled 2017-07-16: qty 50

## 2017-07-16 MED ORDER — SODIUM CHLORIDE 0.9 % IV SOLN
1000.0000 mL | INTRAVENOUS | Status: DC
  Administered 2017-07-16: 1000 mL via INTRAVENOUS

## 2017-07-16 MED ORDER — SODIUM CHLORIDE 0.9 % IV SOLN: 1000.0000 mL | INTRAVENOUS | Status: AC

## 2017-07-16 MED ORDER — STERILE WATER FOR INJECTION IJ SOLN
INTRAMUSCULAR | Status: AC
  Administered 2017-07-16: 17:00:00
  Filled 2017-07-16: qty 10

## 2017-07-16 NOTE — Care Management Note (Signed)
Case Management Note  Patient Details  Name: Erasmo LeventhalHazel Haydon MRN: [id number] Date of Birth: 08/18/1925  Subjective/Objective:                  sepsis  Action/Plan: Date:  July 16, 2017 Chart reviewed for concurrent status and case management needs.  Will continue to follow patient progress.  Discharge Planning: following for needs.  None present at this time of review. Expected discharge date: July 19, 2017 Marcelle SmilingRhonda Davis, BSN, [place], [place]   [phone]   Expected Discharge Date:  (unknown)               Expected Discharge Plan:  Home/Self Care  In-House Referral:     Discharge planning Services  CM Consult  Post Acute Care Choice:    Choice offered to:     DME Arranged:    DME Agency:     HH Arranged:    HH Agency:     Status of Service:  In process, will continue to follow  If discussed at Long Length of Stay Meetings, dates discussed:    Additional Comments:  Golda AcreDavis, Rhonda Lynn, RN 07/16/2017, 9:51 AM

## 2017-07-16 NOTE — Progress Notes (Signed)
[facility]. Foley was [facility] prior to admission to facility[facility] [place]. Was due to be changed 07/15/17.  Will continue to monitor.

## 2017-07-16 NOTE — Evaluation (Signed)
Clinical/Bedside Swallow Evaluation Patient Details  Name: Sara Gross MRN: [id number] Date of Birth: 10/27/1925  Today's Date: 07/16/2017 Time: SLP Start Time (ACUTE ONLY): 1105 SLP Stop Time (ACUTE ONLY): 1144 SLP Time Calculation (min) (ACUTE ONLY): 39 min  Past Medical History:  Past Medical History:  Diagnosis Date  . Aftercare following surgery of the skin or subcutaneous tissue 03/20/2017  . Alzheimer disease   . Chronic low back pain   . Closed fracture of right distal tibia   . Constipation   . Debility 03/20/2017  . Dementia without behavioral disturbance   . Diabetes mellitus without complication (HCC)   . Frailty syndrome in geriatric patient   . Hypoalbuminemia 03/20/2017  . Hypothyroidism   . Mild cognitive impairment, so stated   . Osteoarthritis of multiple joints    with replacement surgeries  . Protein-calorie malnutrition, moderate (HCC) 03/20/2017  . Spinal stenosis at L4-L5 level   . Stage IV pressure ulcer of sacral region (HCC) 03/20/2017  . Total knee replacement status, right   . Type 2 diabetes mellitus (HCC)   . Unstageable pressure ulcer of right heel (HCC) 03/20/2017  . Urge incontinence    Past Surgical History:  Past Surgical History:  Procedure Laterality Date  . distal tibia fracture repair Right   . TOTAL KNEE ARTHROPLASTY Right    HPI:  84 [facility] with congested cough, weakness, incr HR - found to be septic with pna/UTI and Afib/AVR.  PMH + for dementia - bed bound, malnutrition, pressure ulcer, Pt underwent bone biopsy for sacralwound approx 5 weeks ago per chart review.  [facility]  Per review of chart, pt on puree/thin diet with mechanical soft pleasure foods prior to admit.  Daughter reports pt has required someone to feed her within the last few months.     Assessment / Plan / Recommendation Clinical Impression  Pt presents with  clinical indications of severe dysphagia with concern for aspiration of secretions and po intake.   Minimal laryngeal elevation observed likely indicating gross pharyngeal weakness and minimal airway protection.  SLP only provided pt with single small ice chips, 1/4 tsp applesauce due to aspiration concerns.  Recommend NPO x single small ice chips with strict precautions.    Educated pt, daughter and granddaughter to findings/recommendations using teach back.  As daughter reports pt's dysphagia to be "sudden" - hope for functional improvement with strengthening.  MBS may be indicated prior to initiating po dependent on pt/family goals.    Using teach back, educated pt/family.  Will follow up next date for readiness for po and/or instrumental eval. SLP Visit Diagnosis: Dysphagia, oropharyngeal phase (R13.12)    Aspiration Risk  Risk for inadequate nutrition/hydration;Severe aspiration risk    Diet Recommendation Ice chips PRN after oral care   Medication Administration: Via alternative means    Other  Recommendations Oral Care Recommendations: Oral care QID   Follow up Recommendations        Frequency and Duration min 2x/week  1 week       Prognosis Prognosis for Safe Diet Advancement: Fair Barriers to Reach Goals: Severity of deficits      Swallow Study   General Date of Onset: 07/16/17 HPI: 84 [facility] with congested cough, weakness, incr HR - found to be septic with pna/UTI and Afib/AVR.  PMH + for dementia - bed bound, malnutrition, pressure ulcer,  Pt underwent bone biopsy for sacralwound approx 5 weeks ago per chart review.  She has recently been transferred to [facility] to be closer to family who resides at [facility].  Per review of chart, pt on puree/thin diet with mechanical soft pleasure foods prior to admit.  Daughter reports pt has required someone to feed her within the last few months.   Type of Study: Bedside Swallow Evaluation Previous Swallow Assessment:  none in EPIC, daughter reports pt likely had a clinical eval at SNF Diet Prior to this Study: NPO Temperature Spikes Noted: Yes(low grade) Respiratory Status: Nasal cannula History of Recent Intubation: No Behavior/Cognition: Alert;Requires cueing;Lethargic/Drowsy;Distractible Oral Cavity Assessment: Dry;Dried secretions Oral Care Completed by SLP: Yes Oral Cavity - Dentition: Edentulous(dentures do not fit per daughter) Self-Feeding Abilities: Total assist Patient Positioning: Upright in bed Baseline Vocal Quality: Low vocal intensity Volitional Cough: Weak;Congested Volitional Swallow: Unable to elicit    Oral/Motor/Sensory Function Overall Oral Motor/Sensory Function: Generalized oral weakness   Ice Chips Ice chips: Impaired Presentation: Spoon Pharyngeal Phase Impairments: Decreased hyoid-laryngeal movement;Cough - Immediate;Cough - Delayed Other Comments: no laryngeal elevation visualized or palpated despite pt stating she "swallowed"   Thin Liquid Thin Liquid: Not tested    Nectar Thick Nectar Thick Liquid: Not tested   Honey Thick Honey Thick Liquid: Not tested   Puree Puree: Impaired Presentation: Spoon Pharyngeal Phase Impairments: Decreased hyoid-laryngeal movement;Cough - Immediate Other Comments: visualized minimal laryngeal elevation   Solid   GO   Solid: Not tested        Sara Gross 07/16/2017,12:53 PM  Sara Burnet, MS [facility] SLP [phone]

## 2017-07-16 NOTE — Progress Notes (Signed)
Initial Nutrition Assessment  DOCUMENTATION CODES:   Not applicable  INTERVENTION:  - Diet advancement as medically feasible.  - RD will continue to monitor for additional nutrition-related needs.   NUTRITION DIAGNOSIS:   Inadequate oral intake related to inability to eat as evidenced by NPO status.  GOAL:   Patient will meet greater than or equal to 90% of their needs  MONITOR:   Diet advancement, Weight trends, Labs  REASON FOR ASSESSMENT:   Malnutrition Screening Tool  ASSESSMENT:   84 y.o. female with dementia, Type 2 DM, osteoarthritis, hypothyroidism. Discussions about PEG placement had in the past and patient's family had declined. Patient was admitted to SNF 5 weeks ago; bone biopsy had been done to sacral wound and found to be positive for osteomyelitis. Patient has been treated with vancomycin for the past 5 weeks.   BMI indicates overweight status (appropriate for advanced age). Pt has been NPO since admission. SLP did bedside swallow evaluation this AM and recommends only single ice chips, NPO to continue. RD spoke with SLP following her evaluation this AM. SLP reported that daughter informed her that pt had previously been feeding herself but over the past few months has progressed to needing someone to feed her at all times.   RD noted that pt does not have any teeth. Her daughter states that dentures are now ill-fitting d/t weight loss and the dentist has not yet seen pt to make her a new set of dentures. Daughter reports that pt eats very well at nursing facility and that the two days PTA pt was eating 75-95% of meals, drinking ~1/2 bottle of Ensure Max at any one time and was consuming [employer]. Pt has pureed foods at nursing facility and daughter felt that pt was tolerating this without any swallowing difficulty.   Daughter reports that pt requires medications to be crushed. Daughter is very fixated on thinking that all of the changes seen in pt are d/t the night  before admission date pt consuming a pill that had not been fully crushed. Daughter feels that this pill is still stuck in pt's throat and is causing all current medical complications (including all cardiac issues and the need for oxygen) and decreased level of interaction. She is very much so wanting testing done to assess if pill is stuck in the patient's throat. It was difficult to obtain much additional information as daughter discussed this thought and concern for much of the time that RD visited the room.   Physical assessment findings outlined below. Notes indicate that pt has been bedbound since August 2018. RD feels that noted depletions on physical assessment are 2/2 this and advanced age rather than malnutrition-related. Daughter states that pt has lost "a lot of weight" but unable to elicit UBW, amount of weight lost, or time frame for weight loss.   Medications reviewed; sliding scale Novolog, 2 g IV Mg sulfate x1 run today, 10 mEq IV KCl x4 runs today. Labs reviewed; CBGs: 133, 110, and 102 mg/dL, K: 3.1 mmol/L, Ca: 8.3 mg/dL.  IVF: NS @ 75 mL/hr; previously at 125 mL/hr.      NUTRITION - FOCUSED PHYSICAL EXAM:    Most Recent Value  Orbital Region  Mild depletion  Upper Arm Region  Mild depletion  Thoracic and Lumbar Region  Unable to assess  Buccal Region  Mild depletion  Temple Region  Mild depletion  Clavicle Bone Region  Mild depletion  Clavicle and Acromion Bone Region  No depletion  Scapular  Bone Region  Unable to assess  Dorsal Hand  No depletion  Patellar Region  Unable to assess  Anterior Thigh Region  Unable to assess  Posterior Calf Region  Unable to assess  Edema (RD Assessment)  Unable to assess  Hair  Reviewed  Eyes  Reviewed  Mouth  Reviewed  Skin  Reviewed  Nails  Reviewed       Diet Order:  Diet NPO time specified Aspiration precautions  EDUCATION NEEDS:   No education needs have been identified at this time  Skin:  Skin Assessment: Skin  Integrity Issues: Skin Integrity Issues:: Stage IV Stage IV: sacrum and R leg  Last BM:  PTA/unknown  Height:   Ht Readings from Last 1 Encounters:  07/15/17 5\' 5"  (1.651 m)    Weight:   Wt Readings from Last 1 Encounters:  07/15/17 162 lb (73.5 kg)    Ideal Body Weight:  56.82 kg  BMI:  Body mass index is 26.96 kg/m.  Estimated Nutritional Needs:   Kcal:  1835-2060 (25-28 kcal/kg)  Protein:  110-125 grams (1.5-1.7 grams/kg)  Fluid:  >/= 2 L/day     Trenton Gammon, MS, RD, LDN, [facility] Inpatient Clinical Dietitian Pager # [phone] After hours/weekend pager # [phone]

## 2017-07-16 NOTE — Progress Notes (Addendum)
PROGRESS NOTE  Noelani Harbach WUJ:[id number] DOB: [birth year] DOA: 07/15/2017 PCP: Margit Hanks, MD  Brief Summary:   History of hypertension, insulin-dependent type 2 diabetes, hypothyroidism, DVT s/p IVC filter, sent from nursing home to [facility] ED due to above complaints. Patient has dementia, is confused ,not able to provide history.  History obtained from chart review, talking to EDP and daughter who is currently at bedside.  Per daughter patient had multiple hospitalizations on the second part of 2018.  She has been bedbound since August 2018.  Most of her previous medical care has been at [facility]. patient is most recently discharged to skilled nursing facility in [place] to be close to daughter who lives in [place].  She had a bone biopsy for sacral wound and has been on IV vanc for the last 5 weeks.  She has developed cough and fever, she is sent to ED for further evaluation. She is found to have pneumonia and she is in afib/rvr.     HPI/Recap of past 24 hours:  Fever resolved, now converted to sinus rhythm  Assessment/Plan: Active Problems:   Sepsis (HCC)   Pressure injury of skin  Severe sepsis -Sepsis presented on admission with fever, sinus tachycardia, tachypnea, lactic acidosis. -Source of infection including pneumonia, UTI (indwelly foley switched today on 07/16/2017 ), +/-sacral decubitus /osteomyelitis ( though wound does not appear severely infected, see pic below) -She has been on Vanco for osteomyelitis in the sacral region, blood culture in process, Urine culture in process. -improving on IV Vanco and Zosyn.   -palliative care consulted for goals of care. Patient has ongoing risk to have repeated infections.    Dysphagia: has been on pureed diet since 03/2017 N.p.o. for now,  swallow eval, on aspiration protocol. No feeding tube per family.  Sacral decubitus ulcer/sascral osteomyelitis: -Had bone biopsy at outside hospital,  she was discharge to snf on iv vanc for 5 weeks (single lumen PICC line was placed at  [facility] on 05/25/17) -wound does not appear infected. -Wound care consulted.     A. fib RVR (new diagnosis) -she is started on Cardizem drip on admission,  has converted to sinus rhythm, will keep on cardizem drip or now due to npo status.  -Daughter reports patient was taken off anticoagulation (was on for DVT) due to nosebleed in the past -CHADSvasc score is high, discussed with daughter she agrees to Lovenox for now.  Hypokalemia/hypomagnesemia: replace k/mag, keep k>4, mag>2  Insulin-dependent type 2 diabetes A1c 6.4 SSI for now, hypoglycemic protocol ,she is n.p.o.  Dementia, total care, bedbound, malnourished  I had a long conversation with daughter. I explained to daughter that patient is likely to have recurrent infections once finish abx treatment. patient's sacral wound is less likely to heal due to bedbound and poor nutrition.  Patient is at risk of recurrent aspiration pneumonia. Overall prognosis is poor. I advised daughter to discuss and explore options with palliative care. Daughter agreed to palliative care consult.  Family is very reasonable.  DVT prophylaxis: on full dose lovenox  Consultants:  palliative care wound care  Code Status: DNR   Family Communication: patient and family at bedside  Disposition Plan: remain in stepdown May need to switch picc, may need to change out foley  (RN to contact snf for details)  Procedures:  none  Antibiotics:  vanc/zosyn   Objective: BP (!) 100/46 (BP Location: Left Arm)   Pulse 94   Temp 99 F (37.2 C) (  Axillary)   Resp 20   Ht 5\' 5"  (1.651 m)   Wt 73.5 kg (162 lb)   SpO2 100%   BMI 26.96 kg/m   Intake/Output Summary (Last 24 hours) at 07/16/2017 [id number] Last data filed at 07/16/2017 0400 Gross per 24 hour  Intake 2051.75 ml  Output 1550 ml  Net 501.75 ml   Filed Weights   07/15/17 1358  Weight: 73.5 kg  (162 lb)    Exam: Patient is examined daily including today on 07/16/2017, exams remain the same as of yesterday except that has changed    General:  Frail, demented   Cardiovascular: RRR  Respiratory: +scarttered crackles, no wheezing  Abdomen: Soft/ND/NT, positive BS  Musculoskeletal: left lower extremity pitting Edema (report chronic), right lateral LE full thickness wound  Neuro: alert, demented  Skin:     Data Reviewed: Basic Metabolic Panel: Recent Labs  Lab 07/15/17 1352 07/16/17 0125  NA 141  --   K 3.8  --   CL 107  --   CO2 25  --   GLUCOSE 128*  --   BUN 21*  --   CREATININE 0.68 0.57  CALCIUM 8.7*  --    Liver Function Tests: Recent Labs  Lab 07/15/17 1352  AST 43*  ALT 14  ALKPHOS 152*  BILITOT 0.5  PROT 5.8*  ALBUMIN 1.9*   No results for input(s): LIPASE, AMYLASE in the last 168 hours. No results for input(s): AMMONIA in the last 168 hours. CBC: Recent Labs  Lab 07/15/17 1352  WBC 14.1*  NEUTROABS 11.0*  HGB 8.7*  HCT 27.2*  MCV 80.7  PLT 331   Cardiac Enzymes:   No results for input(s): CKTOTAL, CKMB, CKMBINDEX, TROPONINI in the last 168 hours. BNP (last 3 results) No results for input(s): BNP in the last 8760 hours.  ProBNP (last 3 results) No results for input(s): PROBNP in the last 8760 hours.  CBG: Recent Labs  Lab 07/15/17 2006 07/15/17 2352 07/16/17 0345  GLUCAP 147* 158* 133*    Recent Results (from the past 240 hour(s))  Culture, blood (Routine x 2)     Status: None (Preliminary result)   Collection Time: 07/15/17  2:12 PM  Result Value Ref Range Status   Specimen Description BLOOD RIGHT FOREARM  Final   Special Requests IN PEDIATRIC BOTTLE Blood Culture adequate volume  Final   Culture PENDING  Incomplete   Report Status PENDING  Incomplete  MRSA PCR Screening     Status: None   Collection Time: 07/15/17  6:36 PM  Result Value Ref Range Status   MRSA by PCR NEGATIVE NEGATIVE Final    Comment:          The GeneXpert MRSA Assay (FDA approved for NASAL specimens only), is one component of a comprehensive MRSA colonization surveillance program. It is not intended to diagnose MRSA infection nor to guide or monitor treatment for MRSA infections.      Studies: Dg Chest 2 View  Result Date: 07/15/2017 CLINICAL DATA:  Cough, congestion and fever for several days. EXAM: CHEST  2 VIEW COMPARISON:  None. FINDINGS: The cardiomediastinal silhouette is unremarkable. Patchy bilateral airspace opacities, greatest in the left lower lobe, are suspicious for pneumonia. There may be a trace left pleural effusion present. No dominant mass or pneumothorax noted. No acute bony abnormalities are identified. IMPRESSION: Patchy bilateral airspace opacities, greatest in the left lower lobe-suspicious for pneumonia. Radiographic follow-up to resolution recommended. Electronically Signed   By: Harmon PierJeffrey  Hu  M.D.   On: 07/15/2017 15:19    Scheduled Meds: . enoxaparin (LOVENOX) injection  70 mg Subcutaneous BID  . insulin aspart  0-9 Units Subcutaneous Q4H  . polyvinyl alcohol  1 drop Both Eyes BID    Continuous Infusions: . sodium chloride 1,000 mL (07/15/17 1826)  . diltiazem (CARDIZEM) infusion 15 mg/hr (07/16/17 0046)  . piperacillin-tazobactam (ZOSYN)  IV 3.375 g (07/16/17 5784)  . vancomycin       Time spent: 35 mins, case discussed with palliative care Dr Neale Burly.  I have personally reviewed and interpreted on  07/16/2017 daily labs, tele strips, imagings as discussed above under date review session and assessment and plans.  I reviewed all nursing notes, pharmacy notes,  vitals, pertinent old records  I have discussed plan of care as described above with RN , patient and family on 07/16/2017   Albertine Grates MD, PhD  [facility] Pager [phone]. If 7PM-7AM, please contact night-coverage at [email or web address], password [facility] 07/16/2017, 7:38 AM  LOS: 1 day

## 2017-07-16 NOTE — Progress Notes (Signed)
Per IV team, PICC line okay to use.  Will monitor.

## 2017-07-16 NOTE — Consult Note (Signed)
WOC Nurse wound consult note Reason for Consult: Stage 4 Pressure injury to the sacrum (non-healing, chronic) with undermining.  Full thickness wound to lateral right LE. Other minor partial thickness areas of skin loss appropriately treated with silicone foam dressings. Poor nutritional status.  Skin changes at life's end (SCALE).  Seen today with MD who reinforced that no feeding tube will be placed per the famiy's wishes.  Wound care and positioning to be with goal of comfort and maintenance. Wound type:Pressure Pressure Injury POA: Yes Measurement: Sacral ulcer Stage 4:  10cm x 12cm x 3cm with undermining from 11-4 o'clock measuring 3.5cm. 90% red, 10% necrotic wound bed with pocketing indicative of failure to heal/surface infection and bioburden. Right lateral LE full thickness wound:  6cm x 3cm x 0.4cm with 60% yellow, 40% red tissue and small to moderate amount of serous drainage. Medial thighs with healing/reepithelializing Stage 2 pressure injuries from indwelling catheter tubing (medical device related pressure ulcers) Right elbow:  Circular partial thickness 1.4cm round x 0.2cm) red, moist Wound bed:As described above Drainage (amount, consistency, odor) small to moderate amount of serous drainage from the sacral and right lateral LE wounds. All others are dry. Periwound: intact, edematous and dry Dressing procedure/placement/frequency: Prevalon Boots are in place and patient is on a mattress replacement with low air loss while in ICU. I will implement a conservative POC to the Stage 4 sacral pressure ulcer with undermining using three times daily and PRN dressing changes with NS dampened gauze. Turning and repositioning are in place and will minimize time spent in the supine position.  Full thickness wound to the right alteral LE will be dressed daily with xeroform gauze and secured with Kerlix roll gauze to dry and prevent infection. Superficial and partia thickness skin loss areas on the  right elbow and medial thighs will be treated with silicone foam dressings.  WOC nursing team will not follow, but will remain available to this patient, the nursing and medical teams.  Please re-consult if needed. Thanks, Ladona MowLaurie Khadejah Son, MSN, RN, GNP, Hans EdenCWOCN, CWON-AP, FAAN  Pager# [phone]

## 2017-07-16 NOTE — Progress Notes (Signed)
Changes made to pressure injury initial charting based on wound care nurse's recommendations.  Will continue to monitor.

## 2017-07-17 DIAGNOSIS — G3183 Dementia with Lewy bodies: Secondary | ICD-10-CM

## 2017-07-17 DIAGNOSIS — J189 Pneumonia, unspecified organism: Secondary | ICD-10-CM | POA: Diagnosis present

## 2017-07-17 DIAGNOSIS — F0280 Dementia in other diseases classified elsewhere without behavioral disturbance: Secondary | ICD-10-CM

## 2017-07-17 DIAGNOSIS — Z7189 Other specified counseling: Secondary | ICD-10-CM

## 2017-07-17 DIAGNOSIS — Z515 Encounter for palliative care: Secondary | ICD-10-CM

## 2017-07-17 LAB — BASIC METABOLIC PANEL
Anion gap: 8 (ref 5–15)
BUN: 13 mg/dL (ref 6–20)
CHLORIDE: 112 mmol/L — AB (ref 101–111)
CO2: 23 mmol/L (ref 22–32)
Calcium: 8.2 mg/dL — ABNORMAL LOW (ref 8.9–10.3)
Creatinine, Ser: 0.52 mg/dL (ref 0.44–1.00)
GFR calc non Af Amer: >60 mL/min (ref >60)
Glucose, Bld: 113 mg/dL — ABNORMAL HIGH (ref 65–99)
POTASSIUM: 3.7 mmol/L (ref 3.5–5.1)
Sodium: 143 mmol/L (ref 135–145)

## 2017-07-17 LAB — GLUCOSE, CAPILLARY
GLUCOSE-CAPILLARY: 97 mg/dL (ref 65–99)
GLUCOSE-CAPILLARY: 107 mg/dL — AB (ref 65–99)
Glucose-Capillary: 103 mg/dL — ABNORMAL HIGH (ref 65–99)
Glucose-Capillary: 108 mg/dL — ABNORMAL HIGH (ref 65–99)
Glucose-Capillary: 106 mg/dL — ABNORMAL HIGH (ref 65–99)
Glucose-Capillary: 108 mg/dL — ABNORMAL HIGH (ref 65–99)

## 2017-07-17 LAB — CBC
HEMATOCRIT: 24.8 % — AB (ref 36.0–46.0)
HEMOGLOBIN: 8 g/dL — AB (ref 12.0–15.0)
MCH: 25.7 pg — ABNORMAL LOW (ref 26.0–34.0)
MCHC: 32.3 g/dL (ref 30.0–36.0)
MCV: 79.7 fL (ref 78.0–100.0)
Platelets: 375 10*3/uL (ref 150–400)
RBC: 3.11 MIL/uL — ABNORMAL LOW (ref 3.87–5.11)
RDW: 19.3 % — ABNORMAL HIGH (ref 11.5–15.5)
WBC: 11.3 10*3/uL — ABNORMAL HIGH (ref 4.0–10.5)

## 2017-07-17 NOTE — Clinical Social Work Note (Signed)
Clinical Social Work Assessment  Patient Details  Name: Sara Gross MRN: [id number] Date of Birth: Apr 02, 1926  Date of referral:  07/17/17               Reason for consult:  Facility Placement                Permission sought to share information with:    Permission granted to share information::     Name::        Agency::  [facility]  Relationship::  Daughter  Contact Information:     Housing/Transportation Living arrangements for the past 2 months:  [place] of Information:  Adult Children Patient Interpreter Needed:  None Criminal Activity/Legal Involvement Pertinent to Current Situation/Hospitalization:  No - Comment as needed Significant Relationships:  Adult Children Lives with:  Facility Resident Do you feel safe going back to the place where you live?  Yes Need for family participation in patient care:  Yes (Patient is demented.)  Care giving concerns:   Patient admitted for sepsis.  Patient will continue care at post acute facility-[facility]  Facilities manager / plan:  CSW met with patient at beside, explained role and reason for visit-to assist with discharge back to [facility]. Patient daughter report the patient transitioned to Long term care at [facility] Nov. 30. 2018. Patient daughter reports the patient is currently total care and needs assistance with all ADL's.   Plan: SNF  Employment status:  Retired Nurse, adult PT Recommendations:  Not assessed at this time Information / Referral to community resources:  [place]  Patient/Family's Response to care:  Agreeable to plan of care.   Patient/Family's Understanding of and Emotional Response to Diagnosis, Current Treatment, and Prognosis: Patient daughter is knowledgeable of patient care and very involved in the patient care.   Emotional Assessment Appearance:  Appears stated age Attitude/Demeanor/Rapport:    Affect (typically  observed):    Orientation:    Alcohol / Substance use:  Not Applicable Psych involvement (Current and /or in the community):  No (Comment)  Discharge Needs  Concerns to be addressed:  Discharge Planning Concerns Readmission within the last 30 days:  No Current discharge risk:  Cognitively Impaired, Dependent with Mobility Barriers to Discharge:  No Barriers Identified   Lia Hopping, LCSW 07/17/2017, 3:55 PM

## 2017-07-17 NOTE — Progress Notes (Signed)
No charge note:   Palliative consult received.   Meeting scheduled for today at 12pm.  Ocie BobKasie Abad Manard, AGNP-C Palliative Medicine  Please call Palliative Medicine team phone with any questions [phone]. For individual providers please see AMION.

## 2017-07-17 NOTE — Progress Notes (Signed)
PROGRESS NOTE  Sara Gross  ZOX:[id number] DOB: 07-23-1925 DOA: 07/15/2017 PCP: Margit Hanks, MD   Brief Narrative: Sara Gross is a 84 y.o. female with a history of IDT2DM, HTN, dementia total care with 1 year functional decline, hypothyroidism, DVT s/p IVF filter who presented from SNF due to cough, fever found to have pneumonia and AFib w/RVR. Speech evaluation shows severe aspiration risk. Palliative care is consulted.   Assessment & Plan: Active Problems:   Sepsis (HCC)   Pressure injury of skin   HCAP (healthcare-associated pneumonia)   Advance care planning   Goals of care, counseling/discussion   Palliative care by specialist  Severe sepsis due to pneumonia: Sacral decubitus appears uninfected - Continue vancomycin, zosyn.  - Monitor cultures NGTD  Dysphagia: has been on pureed diet since 03/2017, currently severe aspiration risk. No feeding tube per family.  - Continue oral care, aspiration precautions. Can reevaluate after infections treated further  Stage 4 sacral decubitus ulcer: History of osteomyelitis by bone biopsy at OSH, treated with vancomycin x5 weeks started 12/8. Does not appear currently infected.  - WOC consulted, TID and prn W>D dressing changes.  - Offload as able  AFib with RVR: Has converted back to NSR with diltiazem gtt. - Unable to convert to oral medications, so will continue diltiazem IV - On full dose lovenox, though had anticoagulation stopped for DVT in the past due to epistaxis. Will monitor very closely. Anticoagulation d/w daughter.   Hypokalemia/hypomagnesemia: Resolved.  - Monitor daily  IDT2DM: HbA1c 6.4% - SSI, CBGs wnl. Will continue monitoring while NPO.   Dementia,total care: Prolonged decline, family meeting 1/30 elicited pt's values of autonomy. DNR code status. Daughter to discuss further with rest of family, but continuing aggressive measures currently. Agreed to no artificial feeding.  - Really appreciate  palliative care assistance.   DVT prophylaxis: Full dose lovenox Code Status: DNR confirmed with family Family Communication: Family meeting 1/30 Disposition Plan: Uncertain  Consultants:   Palliative care team  Procedures:   None  Antimicrobials:  Vancomycin, zosyn   Subjective: Confused  Objective: Vitals:   07/17/17 1100 07/17/17 1200 07/17/17 1500 07/17/17 1600  BP: (!) 127/53 (!) 124/52 (!) 124/50 (!) 120/55  Pulse: 82 84 83 87  Resp: 16 (!) 23 (!) 24 (!) 26  Temp:    98.4 F (36.9 C)  TempSrc:    Axillary  SpO2: 100% 97% 98% 98%  Weight:      Height:        Intake/Output Summary (Last 24 hours) at 07/17/2017 1726 Last data filed at 07/17/2017 1505 Gross per 24 hour  Intake 1543.67 ml  Output 465 ml  Net 1078.67 ml   Filed Weights   07/15/17 1358  Weight: 73.5 kg (162 lb)    Gen: Elderly, frail female in no distress HEENT: Edentulous.  Pulm: Non-labored breathing. Clear to auscultation bilaterally.  CV: Regular rate and rhythm. No murmur, rub, or gallop. No JVD, no pedal edema. GI: Abdomen soft, non-tender, non-distended, with normoactive bowel sounds. No organomegaly or masses felt. Ext: Warm, dry Skin: Stage IV sacral pressure ulcer with undermining (see picture from progress note from 1/29). Has slough but no purulence.  Neuro: Drowsy, rousable. Not oriented or cooperative with exam.  Psych: UTD  Data Reviewed: I have personally reviewed following labs and imaging studies  CBC: Recent Labs  Lab 07/15/17 1352 07/16/17 0756 07/17/17 0333  WBC 14.1* 12.5* 11.3*  NEUTROABS 11.0* 10.1*  --   HGB 8.7* 8.3* 8.0*  HCT 27.2* 25.5* 24.8*  MCV 80.7 79.9 79.7  PLT 331 371 375   Basic Metabolic Panel: Recent Labs  Lab 07/15/17 1352 07/16/17 0125 07/16/17 0756 07/17/17 0333  NA 141  --  141 143  K 3.8  --  3.1* 3.7  CL 107  --  109 112*  CO2 25  --  24 23  GLUCOSE 128*  --  110* 113*  BUN 21*  --  16 13  CREATININE 0.68 0.57 0.56 0.52    CALCIUM 8.7*  --  8.3* 8.2*  MG  --   --  1.7  --    GFR: Estimated Creatinine Clearance: 47.9 mL/min (by C-G formula based on SCr of 0.52 mg/dL). Liver Function Tests: Recent Labs  Lab 07/15/17 1352 07/16/17 0756  AST 43* 25  ALT 14 13*  ALKPHOS 152* 126  BILITOT 0.5 0.9  PROT 5.8* 5.4*  ALBUMIN 1.9* 1.8*   No results for input(s): LIPASE, AMYLASE in the last 168 hours. No results for input(s): AMMONIA in the last 168 hours. Coagulation Profile: Recent Labs  Lab 07/15/17 2204  INR 1.45   Cardiac Enzymes: No results for input(s): CKTOTAL, CKMB, CKMBINDEX, TROPONINI in the last 168 hours. BNP (last 3 results) No results for input(s): PROBNP in the last 8760 hours. HbA1C: Recent Labs    07/16/17 0125  HGBA1C 6.4*   CBG: Recent Labs  Lab 07/16/17 2331 07/17/17 0349 07/17/17 0823 07/17/17 1220 07/17/17 1617  GLUCAP 116* 103* 97 107* 108*   Lipid Profile: No results for input(s): CHOL, HDL, LDLCALC, TRIG, CHOLHDL, LDLDIRECT in the last 72 hours. Thyroid Function Tests: No results for input(s): TSH, T4TOTAL, FREET4, T3FREE, THYROIDAB in the last 72 hours. Anemia Panel: No results for input(s): VITAMINB12, FOLATE, FERRITIN, TIBC, IRON, RETICCTPCT in the last 72 hours. Urine analysis:    Component Value Date/Time   COLORURINE AMBER (A) 07/15/2017 1411   APPEARANCEUR CLOUDY (A) 07/15/2017 1411   LABSPEC 1.021 07/15/2017 1411   PHURINE 5.0 07/15/2017 1411   GLUCOSEU 50 (A) 07/15/2017 1411   HGBUR SMALL (A) 07/15/2017 1411   BILIRUBINUR NEGATIVE 07/15/2017 1411   KETONESUR 5 (A) 07/15/2017 1411   PROTEINUR 30 (A) 07/15/2017 1411   NITRITE POSITIVE (A) 07/15/2017 1411   LEUKOCYTESUR LARGE (A) 07/15/2017 1411   Recent Results (from the past 240 hour(s))  Urine culture     Status: Abnormal   Collection Time: 07/15/17  2:11 PM  Result Value Ref Range Status   Specimen Description URINE, CLEAN CATCH  Final   Special Requests NONE  Final   Culture MULTIPLE  SPECIES PRESENT, SUGGEST RECOLLECTION (A)  Final   Report Status 07/16/2017 FINAL  Final  Culture, blood (Routine x 2)     Status: None (Preliminary result)   Collection Time: 07/15/17  2:12 PM  Result Value Ref Range Status   Specimen Description BLOOD RIGHT FOREARM  Final   Special Requests IN PEDIATRIC BOTTLE Blood Culture adequate volume  Final   Culture   Final    NO GROWTH 2 DAYS Performed at [facility], [address] [place], [place] [address]    Report Status PENDING  Incomplete  MRSA PCR Screening     Status: None   Collection Time: 07/15/17  6:36 PM  Result Value Ref Range Status   MRSA by PCR NEGATIVE NEGATIVE Final    Comment:        The GeneXpert MRSA Assay (FDA approved for NASAL specimens only), is  one component of a comprehensive MRSA colonization surveillance program. It is not intended to diagnose MRSA infection nor to guide or monitor treatment for MRSA infections.   Culture, blood (Routine x 2)     Status: None (Preliminary result)   Collection Time: 07/15/17  6:39 PM  Result Value Ref Range Status   Specimen Description BLOOD RIGHT HAND  Final   Special Requests IN PEDIATRIC BOTTLE Blood Culture adequate volume  Final   Culture   Final    NO GROWTH 2 DAYS Performed at [facility], [address] [place], [place] [address]    Report Status PENDING  Incomplete      Radiology Studies: No results found.  Scheduled Meds: . chlorhexidine  15 mL Mouth Rinse BID  . Chlorhexidine Gluconate Cloth  6 each Topical Daily  . enoxaparin (LOVENOX) injection  70 mg Subcutaneous BID  . insulin aspart  0-9 Units Subcutaneous Q4H  . mouth rinse  15 mL Mouth Rinse q12n4p  . polyvinyl alcohol  1 drop Both Eyes BID   Continuous Infusions: . sodium chloride 1,000 mL (07/16/17 1755)  . diltiazem (CARDIZEM) infusion 5 mg/hr (07/16/17 2233)  . piperacillin-tazobactam (ZOSYN)  IV 3.375 g (07/17/17 1506)  . vancomycin Stopped (07/17/17 1505)     LOS:  2 days   Time spent: 35 minutes.  Hazeline Junker, MD [facility] Pager [phone]  If 7PM-7AM, please contact night-coverage [email or web address] Password [facility] 07/17/2017, 5:26 PM

## 2017-07-17 NOTE — Consult Note (Signed)
Consultation Note Date: 07/17/2017   Patient Name: Sara Gross  DOB: Aug 07, 1925  MRN: [id number]  Age / Sex: 84 y.o., female  PCP: Hennie Duos, MD Referring Physician: Patrecia Pour, MD  Reason for Consultation: Establishing goals of care  HPI/Patient Profile: 84 y.o. female  with past medical history of dementia, osteomyelitis at location of sacral decubitis (on vancomycin x 5weeks), FTT, DM2, osteoarthritis, admitted on 07/15/2017 with sepsis, pneumonia. Workup reveals aspiration, a fib with RVR. Patient NPO. Palliative medicine consulted for [place].    Clinical Assessment and Goals of Care:  I have reviewed medical records including EPIC notes, labs and imaging, assessed the patient and then met at the bedside along with patient's daughter Dub Amis, granddaughter Lynelle Smoke, and daughter Lenell Antu (via speakerphone) to discuss diagnosis prognosis, GOC, EOL wishes, disposition and options.  I introduced Palliative Medicine as specialized medical care for people living with serious illness. It focuses on providing relief from the symptoms and stress of a serious illness. The goal is to improve quality of life for both the patient and the family.  We discussed a brief life review of the patient. 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 Taking care of people was always very important to her.   As far as functional and nutritional status- she has declined significantly over the last year. She has been bedbound since May. Mazie notes she hasn't fed herself since August. She carries on conversations at times, but is nonverbal at other times.     We discussed their current illness and what it means in the larger context of their on-going co-morbidities.  Natural disease trajectory and expectations at EOL were discussed. We discussed the recurrent nature of aspiration pneumonia and sepsis related to  osteomyelitis. We discussed that each hospitalization leads to lower level of functioning and worsening cognition. Mazie and Lenell Antu had been discussing this morning that they did not want to continue to see their mother suffering.   I attempted to elicit values and goals of care important to the patient. She was always independent. She did not enjoy having other people care for her.  The difference between aggressive medical intervention and comfort care was considered in light of the patient's goals of care. The option of transitioning to comfort care and residential hospice was offered and discussed. We discussed stopping IV antibiotics, IV medications and fluids and allowing for a peaceful dignified death without suffering. We discussed allowing for comfort feeding and drinking if the patient requested. We discussed that comfort medications would be given for pain, SOB or other symptoms.   Advanced directives, concepts specific to code status, artifical feeding and hydration, and rehospitalization were considered and discussed. The patient has been very clear in her directions to her children regarding DNR, and no artificial feeding.   Hospice and Palliative Care services outpatient were explained and offered.  Questions and concerns were addressed.  Hard Choices booklet left for review. The family was encouraged to call with questions or concerns.  Primary Decision Maker NEXT OF KIN- patient's children- Mazie is the spokesperson     SUMMARY OF RECOMMENDATIONS -No feeding tube -Continue current level of care -While there are very clear advance directives- this is the first [place] conversation that has been had with this family where Hospice and comfort care has been discussed. They had not considered the cyclical nature of infection and hospitalizations that would not improve patient's function and quality of life.  -Mazie is going to discuss this new information with her family and consider  hospice and comfort care. -PMT will follow up with Mazie tomorrow  Code Status/Advance Care Planning:  DNR  Palliative Prophylaxis:   Aspiration and Frequent Pain Assessment  Prognosis:    < 6 months  Discharge Planning: To Be Determined  Primary Diagnoses: Present on Admission: . Sepsis ([place])   I have reviewed the medical record, interviewed the patient and family, and examined the patient. The following aspects are pertinent.  Past Medical History:  Diagnosis Date  . Aftercare following surgery of the skin or subcutaneous tissue 03/20/2017  . Alzheimer disease   . Chronic low back pain   . Closed fracture of right distal tibia   . Constipation   . Debility 03/20/2017  . Dementia without behavioral disturbance   . Diabetes mellitus without complication ([place])   . Frailty syndrome in geriatric patient   . Hypoalbuminemia 03/20/2017  . Hypothyroidism   . Mild cognitive impairment, so stated   . Osteoarthritis of multiple joints    with replacement surgeries  . Protein-calorie malnutrition, moderate ([place]) 03/20/2017  . Spinal stenosis at L4-L5 level   . Stage IV pressure ulcer of sacral region ([place]) 03/20/2017  . Total knee replacement status, right   . Type 2 diabetes mellitus ([place])   . Unstageable pressure ulcer of right heel ([place]) 03/20/2017  . Urge incontinence    Social History   Socioeconomic History  . Marital status: Widowed    Spouse name: None  . Number of children: None  . Years of education: None  . Highest education level: None  Social Needs  . Financial resource strain: None  . Food insecurity - worry: None  . Food insecurity - inability: None  . Transportation needs - medical: None  . Transportation needs - non-medical: None  Occupational History  . Occupation: retired Psychologist, counselling   Tobacco Use  . Smoking status: Never Smoker  . Smokeless tobacco: Never Used  Substance and Sexual Activity  . Alcohol use: No    Frequency: Never  .  Drug use: No  . Sexual activity: No  Other Topics Concern  . None  Social History Narrative   Admitted to [facility] 05/17/17   Widowed   Never smoke   Alcohol none   POA, DNR   History reviewed. No pertinent family history. Scheduled Meds: . chlorhexidine  15 mL Mouth Rinse BID  . Chlorhexidine Gluconate Cloth  6 each Topical Daily  . enoxaparin (LOVENOX) injection  70 mg Subcutaneous BID  . insulin aspart  0-9 Units Subcutaneous Q4H  . mouth rinse  15 mL Mouth Rinse q12n4p  . polyvinyl alcohol  1 drop Both Eyes BID   Continuous Infusions: . sodium chloride 1,000 mL (07/16/17 1755)  . diltiazem (CARDIZEM) infusion 5 mg/hr (07/16/17 2233)  . piperacillin-tazobactam (ZOSYN)  IV Stopped (07/17/17 1013)  . vancomycin 750 mg (07/17/17 1307)   PRN Meds:.ketorolac, sodium chloride flush Medications Prior to Admission:  Prior to Admission medications  Medication Sig Start Date End Date Taking? Authorizing Provider  acetaminophen (TYLENOL) 325 MG tablet Take 650 mg by mouth every 6 (six) hours as needed for moderate pain.    Yes [provider]  Amino Acids-Protein Hydrolys (FEEDING SUPPLEMENT, PRO-STAT SUGAR FREE 64,) LIQD Take 30 mLs by mouth 2 (two) times daily.   Yes [provider]  amLODipine (NORVASC) 5 MG tablet Take 5 mg by mouth daily. Hold for SBP less than 120   Yes [provider]  amoxicillin-clavulanate (AUGMENTIN) 875-125 MG tablet Take 1 tablet by mouth 2 (two) times daily. x7 days 07/15/17  Yes [provider]  aspirin EC 81 MG tablet Take 81 mg by mouth daily.   Yes [provider]  Carboxymethylcellulose Sodium (LUBRICANT EYE DROPS OP) Place 1 drop into both eyes 2 (two) times daily.   Yes [provider]  GLUCERNA (GLUCERNA) LIQD Take 237 mLs by mouth 2 (two) times daily between meals. due to weight loss   Yes [provider]  insulin lispro (HUMALOG KWIKPEN) 100 UNIT/ML KiwkPen Inject into  the skin. Sliding scale 70-120 =0 units, 121-150= 2 units, 151-200= 3 units, 201-250=5 units, 251-300=8 units, 301-350=11 units, 351-400=15 units. Greater than 400 call MD and give 15 units.   Yes [provider]  levothyroxine (SYNTHROID, LEVOTHROID) 112 MCG tablet Take 112 mcg by mouth daily before breakfast.   Yes [provider]  NON FORMULARY Take 1 each by mouth 3 (three) times daily with meals. "Magic Cup"   Yes [provider]  nutrition supplement, JUVEN, (JUVEN) PACK Take 1 packet by mouth 2 (two) times daily between meals.   Yes [provider]  pantoprazole (PROTONIX) 40 MG tablet Take 40 mg by mouth daily.    Yes [provider]  polyethylene glycol (MIRALAX / GLYCOLAX) packet Take 17 g by mouth daily.    Yes [provider]  potassium chloride (K-DUR,KLOR-CON) 10 MEQ tablet Take 10 mEq by mouth daily.   Yes [provider]  traMADol (ULTRAM) 50 MG tablet Take 50 mg by mouth every 8 (eight) hours as needed for moderate pain. Take 30 mins before wound care daily    Yes [provider]  vancomycin 1,000 mg in sodium chloride 0.9 % 250 mL Inject 1,000 mg into the vein every 12 (twelve) hours. Vanco 1 gram/150 ml-0.9%NACL--Infuse 250 ml via PICC daily for Osteomyelitis 07/11/17  Yes [provider]  Vancomycin HCl in NaCl 1.25-0.9 GM/150ML-% SOLN Inject 1,250 mg into the vein daily.   Yes [provider]  vitamin C (ASCORBIC ACID) 500 MG tablet Take 500 mg by mouth 2 (two) times daily. promote wound healing   Yes [provider]   Allergies  Allergen Reactions  . Januvia [Sitagliptin]     transaminitis    Review of Systems  Unable to perform ROS: Dementia    Physical Exam  Constitutional:  Frail, cachexic  HENT:  Head: Atraumatic.  No teeth  Cardiovascular:  irregular  Pulmonary/Chest: Effort normal.  Neurological:  Not oriented to place  Skin: Skin is warm and dry.  Nursing  note and vitals reviewed.   Vital Signs: BP (!) 124/52   Pulse 84   Temp 97.7 F (36.5 C) (Oral)   Resp (!) 23   Ht 5' 5" (1.651 m)   Wt 73.5 kg (162 lb)   SpO2 97%   BMI 26.96 kg/m  Pain Assessment: No/denies pain   Pain Score: 0-No pain   SpO2:  SpO2: 97 % O2 Device:SpO2: 97 % O2 Flow Rate: .O2 Flow Rate (L/min): 4 L/min  IO: Intake/output summary:   Intake/Output Summary (Last 24 hours) at 07/17/2017 1401 Last data filed at 07/17/2017 0600 Gross per 24 hour  Intake 1393.67 ml  Output 465 ml  Net 928.67 ml    LBM: Last BM Date: (PTA - pt unable to state date of last BM) Baseline Weight: Weight: 73.5 kg (162 lb) Most recent weight: Weight: 73.5 kg (162 lb)     Palliative Assessment/Data: PPS: 10%     Thank you for this consult. Palliative medicine will continue to follow and assist as needed.   Time In: 1145 Time Out: 1330 Time Total: 105 mins Prolong services billed: Yes Greater than 50%  of this time was spent counseling and coordinating care related to the above assessment and plan.  Signed by: Mariana Kaufman, AGNP-C Palliative Medicine    Please contact Palliative Medicine Team phone at [phone] for questions and concerns.  For individual provider: See Shea Evans

## 2017-07-17 NOTE — Progress Notes (Signed)
SLP Cancellation Note  Patient Details Name: Sara Gross MRN: [id number] DOB: 10/09/1925   Cancelled treatment:       Reason Eval/Treat Not Completed: Other (comment)(palliative referral pending for noon today, pt remains with congested cough, will follow up after meeting, thanks)   Chales AbrahamsKimball, Shakari Qazi Ann 07/17/2017, 9:37 AM  Donavan Burnetamara Palmer Fahrner, MS [facility] SLP [phone]

## 2017-07-18 ENCOUNTER — Encounter: Payer: Self-pay | Admitting: Internal Medicine

## 2017-07-18 ENCOUNTER — Inpatient Hospital Stay (HOSPITAL_COMMUNITY): Payer: Medicare Other

## 2017-07-18 DIAGNOSIS — R600 Localized edema: Secondary | ICD-10-CM | POA: Diagnosis present

## 2017-07-18 DIAGNOSIS — R627 Adult failure to thrive: Secondary | ICD-10-CM | POA: Insufficient documentation

## 2017-07-18 DIAGNOSIS — I472 Ventricular tachycardia, unspecified: Secondary | ICD-10-CM

## 2017-07-18 DIAGNOSIS — I4891 Unspecified atrial fibrillation: Secondary | ICD-10-CM | POA: Diagnosis present

## 2017-07-18 DIAGNOSIS — M79609 Pain in unspecified limb: Secondary | ICD-10-CM

## 2017-07-18 DIAGNOSIS — M7989 Other specified soft tissue disorders: Secondary | ICD-10-CM

## 2017-07-18 LAB — GLUCOSE, CAPILLARY
GLUCOSE-CAPILLARY: 110 mg/dL — AB (ref 65–99)
GLUCOSE-CAPILLARY: 116 mg/dL — AB (ref 65–99)
Glucose-Capillary: 107 mg/dL — ABNORMAL HIGH (ref 65–99)
Glucose-Capillary: 119 mg/dL — ABNORMAL HIGH (ref 65–99)
Glucose-Capillary: 135 mg/dL — ABNORMAL HIGH (ref 65–99)
Glucose-Capillary: 95 mg/dL (ref 65–99)

## 2017-07-18 LAB — BASIC METABOLIC PANEL
ANION GAP: 7 (ref 5–15)
BUN: 8 mg/dL (ref 6–20)
CALCIUM: 7.8 mg/dL — AB (ref 8.9–10.3)
CO2: 22 mmol/L (ref 22–32)
Chloride: 110 mmol/L (ref 101–111)
Creatinine, Ser: 0.44 mg/dL (ref 0.44–1.00)
GFR calc Af Amer: >60 mL/min (ref >60)
GFR calc non Af Amer: >60 mL/min (ref >60)
GLUCOSE: 100 mg/dL — AB (ref 65–99)
Potassium: 2.8 mmol/L — ABNORMAL LOW (ref 3.5–5.1)
Sodium: 139 mmol/L (ref 135–145)

## 2017-07-18 LAB — CBC
HCT: 23.9 % — ABNORMAL LOW (ref 36.0–46.0)
HEMOGLOBIN: 7.9 g/dL — AB (ref 12.0–15.0)
MCH: 25.9 pg — AB (ref 26.0–34.0)
MCHC: 33.1 g/dL (ref 30.0–36.0)
MCV: 78.4 fL (ref 78.0–100.0)
Platelets: 387 10*3/uL (ref 150–400)
RBC: 3.05 MIL/uL — ABNORMAL LOW (ref 3.87–5.11)
RDW: 19.3 % — AB (ref 11.5–15.5)
WBC: 10.2 10*3/uL (ref 4.0–10.5)

## 2017-07-18 MED ORDER — MORPHINE SULFATE (PF) 4 MG/ML IV SOLN
2.0000 mg | INTRAVENOUS | Status: DC | PRN
  Administered 2017-07-18 – 2017-07-19 (×4): 2 mg via INTRAVENOUS
  Filled 2017-07-18 (×4): qty 1

## 2017-07-18 MED ORDER — POTASSIUM CHLORIDE 2 MEQ/ML IV SOLN
INTRAVENOUS | Status: DC
  Administered 2017-07-18: 13:00:00 via INTRAVENOUS
  Filled 2017-07-18 (×3): qty 1000

## 2017-07-18 MED ORDER — MORPHINE SULFATE (PF) 4 MG/ML IV SOLN: 2.0000 mg | INTRAVENOUS | Status: DC | PRN

## 2017-07-18 MED ORDER — KCL IN DEXTROSE-NACL 40-5-0.45 MEQ/L-%-% IV SOLN
INTRAVENOUS | Status: DC
  Administered 2017-07-19: 04:00:00 via INTRAVENOUS

## 2017-07-18 MED ORDER — POTASSIUM CHLORIDE 10 MEQ/100ML IV SOLN
10.0000 meq | INTRAVENOUS | Status: AC
  Administered 2017-07-18 (×6): 10 meq via INTRAVENOUS
  Filled 2017-07-18 (×4): qty 100

## 2017-07-18 MED ORDER — MAGNESIUM SULFATE 2 GM/50ML IV SOLN
2.0000 g | Freq: Once | INTRAVENOUS | Status: AC
  Administered 2017-07-18: 2 g via INTRAVENOUS
  Filled 2017-07-18: qty 50

## 2017-07-18 NOTE — Progress Notes (Signed)
*  PRELIMINARY RESULTS* Vascular Ultrasound Right upper extremity venous duplex has been completed.  Preliminary findings: No obvious signs of acute deep vein thrombosis in the visualized veins of the right upper extremity.  Suspicious area in the mid right subclavian in 2D, where PICC is visualized, unable to compress due to proximity to bone, area demonstrates color fill.  Somewhat difficult exam due to patient cooperation and position.  Chauncey FischerCharlotte C Taje Tondreau 07/18/2017, 3:57 PM

## 2017-07-18 NOTE — Progress Notes (Signed)
  Speech Language Pathology Treatment: Dysphagia  Patient Details Name: Sara Gross MRN: [id number] DOB: 10/16/1925 Today's Date: 07/18/2017 Time: [id number] SLP Time Calculation (min) (ACUTE ONLY): 38 min  Assessment / Plan / Recommendation Clinical Impression  Today pt with increased RR to 30's and HR to 130s during exertion of oral care/moisture provided by SLP.  Cough remains weak without ability at this time to clear secretions.  Daughter reports pt with copious coughing last night expectorating viscous yellow tinged secretions after oral care.  Daughter independently providing oral care- oral suction.  Suspect cough elicited after oral care is due to aspiration of water/moisture.  Pt also complained of chest discomfort with exertion which abated with HOB lowering and rest.  Did not provide pt intake of single ice chips due to taxing system.  Pt is not ready for po at this time = concern for recovery in acute setting present given lack of improvement in the last two days.  Will follow up for po readiness/instrumental evaluation readiness.  Educated pt and daughter using teach back.     HPI HPI: 84 yo female [facility] with congested cough, weakness, incr HR - found to be septic with pna/UTI and Afib/AVR.  PMH + for dementia - bed bound, malnutrition, pressure ulcer, Pt underwent bone biopsy for sacralwound approx 5 weeks ago per chart review.  [facility]  Per review of chart, pt on puree/thin diet with mechanical soft pleasure foods prior to admit.  Daughter reports pt has required someone to feed her within the last few months.        SLP Plan  Continue with current plan of care       Recommendations  Diet recommendations: NPO- oral care, single SMALL ice chips only when fully alert/tolerating  Medication Administration: Via alternative means                Oral Care Recommendations: Oral care  QID SLP Visit Diagnosis: Dysphagia, oropharyngeal phase (R13.12) Plan: Continue with current plan of care       GO                Sara Gross 07/18/2017, 11:30 AM  Sara Burnetamara Lancelot Alyea, MS [facility] SLP [phone]

## 2017-07-18 NOTE — Progress Notes (Signed)
Daily Progress Note   Patient Name: Sara LeventhalHazel Gross       Date: 07/18/2017 DOB: 01/09/1926  Age: 84 y.o. MRN#: [id number] Attending Physician: Tyrone NineGrunz, Ryan B, MD Primary Care Physician: [name], [name], MD Admit Date: 07/15/2017  Reason for Consultation/Follow-up: Establishing goals of care  Subjective: Decreased LOC today. Not opening eyes. Not eating. Runs of Vtach. RR increased, brow furrowed. Per RN report R arm swollen and painful.   Spoke with pt daughter, Laurena SpiesMazie via phone to followup from conversation yesterday at which time transition to comfort care and residential Hospice was discussed. [facility] spoke as if she expected pt to recover and stated she wanted "Palliative" services at discharge. Reviewed with Mazie that Palliative services would not provide the level of care, and concerns that patient may not survive this hospitalization. Our conversation was cut short due to [facility] cell [facility] dying.   [facility]- reviewed her conversation with Dr. Jarvis NewcomerGrunz- Mazie recounted that Dr. Jarvis NewcomerGrunz did tell her that would "see how she did over the next day" and then talk about Hospice.   Discussed with Mazie patient's poor prognosis, and while my hope is that patient would improve my fear is that she isn't and that Mazie should prepare to transition patient to comfort measures tomorrow. We talked about residential hospice again. Mazie requested if patient could [place]   Review of Systems  Unable to perform ROS: Mental status change    Length of Stay: 3  Current Medications: Scheduled Meds:  . chlorhexidine  15 mL Mouth Rinse BID  . Chlorhexidine Gluconate Cloth  6 each Topical Daily  . enoxaparin (LOVENOX) injection  70 mg Subcutaneous  BID  . insulin aspart  0-9 Units Subcutaneous Q4H  . mouth rinse  15 mL Mouth Rinse q12n4p  . polyvinyl alcohol  1 drop Both Eyes BID    Continuous Infusions: . dextrose 5 % and 0.45% NaCl 1,000 mL with potassium chloride 40 mEq infusion 75 mL/hr at 07/18/17 1240  . diltiazem (CARDIZEM) infusion 10 mg/hr (07/18/17 1131)  . piperacillin-tazobactam (ZOSYN)  IV 3.375 g (07/18/17 1430)  . potassium chloride 10 mEq (07/18/17 1300)    PRN Meds: ketorolac, morphine injection, sodium chloride flush  Physical Exam  Constitutional:  cachexia  Cardiovascular:  RUE edema,  tachycardic  Pulmonary/Chest: She has rales.  Tachypneac    Abdominal: Soft.  Neurological:  Does not open eyes, mumbles incoherently to questions  Nursing note and vitals reviewed.           Vital Signs: BP 131/68   Pulse (!) 110   Temp 97.7 F (36.5 C) (Oral)   Resp (!) 27   Ht 5\' 5"  (1.651 m)   Wt 73.5 kg (162 lb)   SpO2 96%   BMI 26.96 kg/m  SpO2: SpO2: 96 % O2 Device: O2 Device: Nasal Cannula O2 Flow Rate: O2 Flow Rate (L/min): 4 L/min  Intake/output summary:   Intake/Output Summary (Last 24 hours) at 07/18/2017 1508 Last data filed at 07/18/2017 1200 Gross per 24 hour  Intake 2280 ml  Output 1650 ml  Net 630 ml   LBM: Last BM Date: (PTA - pt unable to state date of last BM) Baseline Weight: Weight: 73.5 kg (162 lb) Most recent weight: Weight: 73.5 kg (162 lb)       Palliative Assessment/Data: PPS: 10%      Patient Active Problem List   Diagnosis Date Noted  . HCAP (healthcare-associated pneumonia)   . Advance care planning   . Goals of care, counseling/discussion   . Palliative care by specialist   . Sepsis (HCC) 07/15/2017  . Pressure injury of skin 07/15/2017  . Decubitus ulcer of buttock, unstageable (HCC) 05/28/2017  . Sepsis due to Peptostreptococcus species (HCC) 05/28/2017  . Severe protein-calorie malnutrition (HCC) 05/28/2017  . Acute deep vein thrombosis (DVT) of lower  extremity (HCC) 05/28/2017  . Hypertension 05/28/2017  . GERD (gastroesophageal reflux disease) 05/28/2017  . Chronic anemia 05/28/2017  . Alzheimer disease   . Chronic low back pain   . Closed fracture of right distal tibia   . Constipation   . Dementia without behavioral disturbance   . Diabetes mellitus without complication (HCC)   . Frailty syndrome in geriatric patient   . Hypothyroidism   . Mild cognitive impairment, so stated   . Osteoarthritis of multiple joints   . Spinal stenosis at L4-L5 level   . Total knee replacement status, right   . Type 2 diabetes mellitus (HCC)   . Urge incontinence   . Aftercare following surgery of the skin or subcutaneous tissue 03/20/2017  . Debility 03/20/2017  . Hypoalbuminemia 03/20/2017  . Protein-calorie malnutrition, moderate (HCC) 03/20/2017  . Stage IV pressure ulcer of sacral region (HCC) 03/20/2017  . Unstageable pressure ulcer of right heel (HCC) 03/20/2017    Palliative Care Assessment & Plan   Patient Profile: 84 y.o. female  with past medical history of dementia, osteomyelitis at location of sacral decubitis (on vancomycin x 5weeks), FTT, DM2, osteoarthritis, admitted on 07/15/2017 with sepsis, pneumonia. Workup reveals aspiration, a fib with RVR. Patient NPO. Palliative medicine consulted for GOC.    Assessment/Recommendations/Plan   Patient has worsened in status since yesterday, I believe she is dying and discussed this with her daughter- discussed this concern with daughter- Laurena Spies states that she feels this too, she is just having trouble accepting with how fast it is happening  Mazie requests full scope care for tonight and will likely transition to comfort care tomorrow- she is feeling significant stress from her siblings asking her many questions and needing much support from medical providers- she requests residential hosp[facility]  RUE doppler ordered to eval for clot at [facility] site per [facility]  request for additional guidance in GOC   Agree with  2mg  morphine q4hr prn for comfort- observed administration and this significantly improved patient's comfort  Goals of Care and Additional Recommendations:  Limitations on Scope of Treatment: Full Scope Treatment  Code Status:  DNR  Prognosis:   Hours - Days  Discharge Planning:  To Be Determined likely residential hospice  Care plan was discussed with patient's daughterLaurena Spies, patient's RN- Neysa Bonito, and Dr. Jarvis Newcomer.   Thank you for allowing the Palliative Medicine Team to assist in the care of this patient.   Time In: 1415 Time Out: 1505 Total Time 50 mins Prolonged Time Billed Yes      Greater than 50%  of this time was spent counseling and coordinating care related to the above assessment and plan.  Ocie Bob, AGNP-C Palliative Medicine   Please contact Palliative Medicine Team phone at [phone] for questions and concerns.

## 2017-07-18 NOTE — Progress Notes (Addendum)
PROGRESS NOTE  Sara Gross  ZOX:[id number] DOB: 01-29-1926 DOA: 07/15/2017 PCP: Margit Hanks, MD   Brief Narrative: Sara Gross is a 84 y.o. female with a history of IDT2DM, HTN, dementia total care with 1 year functional decline, hypothyroidism, DVT s/p IVF filter who presented from SNF due to cough, fever found to have pneumonia and AFib w/RVR. Speech evaluation shows severe aspiration risk, so patient is kept NPO. Palliative care is consulted.   Assessment & Plan: Active Problems:   Sepsis (HCC)   Pressure injury of skin   HCAP (healthcare-associated pneumonia)   Advance care planning   Goals of care, counseling/discussion   Palliative care by specialist  Severe sepsis due to pneumonia: Sacral decubitus appears uninfected - Continue zosyn, will DC vancomycin as MRSA PCR negative - Monitor cultures NGTDx3 days  Dysphagia: has been on pureed diet since 03/2017, currently severe aspiration risk on repeated SLP evaluations while admitted. No feeding tube per family.  - Continue oral care, aspiration precautions. Can reevaluate as infections treated further. Discussed at length with pt's family that if her mental status/dysphagia does not improve, this would limit life expectancy to days-weeks qualifying her for residential hospice.   Stage 4 sacral decubitus ulcer: History of osteomyelitis by bone biopsy at OSH, treated with vancomycin x5 weeks started 12/8 after treatment in hospital. Does not appear currently infected.  - WOC consulted, TID and prn W>D dressing changes.  - Offload as able  AFib with RVR:  - Uncontrolled this morning, asked RN to increase diltiazem to 10/hr.  - On full dose lovenox, though had anticoagulation stopped for DVT in the past due to epistaxis. Will monitor very closely. Anticoagulation d/w daughter.   Hypokalemia/hypomagnesemia: Will replace with runs and put into MIVF - Monitor daily  IDT2DM: HbA1c 6.4% - SSI, CBGs wnl. Will continue  monitoring while NPO.   Anemia: Hgb near baseline, trending slightly downward with IVF's.  - Monitor CBC in AM - No evidence of bleeding  Dementia,total care: Prolonged decline, family meeting 1/30 elicited pt's values of autonomy. DNR code status. Daughter to discuss further with rest of family, but continuing aggressive measures currently. Agreed to no artificial feeding.  - Really appreciate palliative care assistance.   DVT prophylaxis: Full dose lovenox Code Status: DNR confirmed with family Family Communication: Family meeting 1/30; I discussed with daughter (at bedside) and son (by speaker phone) at length 1/31 as well.  Disposition Plan: Uncertain, monitoring for improvement over next 24-48 hrs. If no clinical benefit of treatment (despite resolution of leukocytosis, control of HR, etc.), I have recommended residential hospice to multiple members of her family as the patient's wishes were never to be in her current situation and would not want artificial feeding.   Consultants:   Palliative care team  Procedures:   None  Antimicrobials:  Vancomycin 1/28 - 1/30  Zosyn 1/28 >>   Subjective: Confused, more drowsy this morning, able to mumble one word answers, but won't open her eyes after being asked.   Objective: Vitals:   07/18/17 0400 07/18/17 0500 07/18/17 0600 07/18/17 0800  BP: (!) 114/54 113/60 123/70   Pulse: (!) 104 98 (!) 132   Resp: (!) 27 (!) 24 (!) 25   Temp:    98.4 F (36.9 C)  TempSrc:    Oral  SpO2: 100% 100% 100%   Weight:      Height:        Intake/Output Summary (Last 24 hours) at 07/18/2017 1226 Last data filed  at 07/18/2017 0800 Gross per 24 hour  Intake 2430 ml  Output 1450 ml  Net 980 ml   Filed Weights   07/15/17 1358  Weight: 73.5 kg (162 lb)    Gen: Elderly, frail female in no distress sleeping with mouth wide open. HEENT: Edentulous.  Pulm: Non-labored breathing with supplemental oxygen. Clear to auscultation bilaterally.    CV: Irregular tachycardia with rate in 110's. No murmur, rub, or gallop. No JVD, no pedal edema. GI: Abdomen soft, non-tender, non-distended, with normoactive bowel sounds. No organomegaly or masses felt. Ext: Warm, dry Skin: Stage IV sacral pressure ulcer with undermining (see picture from progress note from 1/29). Has slough but no purulence. Heels floated w/boots. Neuro: Drowsy, rousable. Not oriented or cooperative with exam.  Psych: UTD  Data Reviewed: I have personally reviewed following labs and imaging studies  CBC: Recent Labs  Lab 07/15/17 1352 07/16/17 0756 07/17/17 0333 07/18/17 0323  WBC 14.1* 12.5* 11.3* 10.2  NEUTROABS 11.0* 10.1*  --   --   HGB 8.7* 8.3* 8.0* 7.9*  HCT 27.2* 25.5* 24.8* 23.9*  MCV 80.7 79.9 79.7 78.4  PLT 331 371 375 387   Basic Metabolic Panel: Recent Labs  Lab 07/15/17 1352 07/16/17 0125 07/16/17 0756 07/17/17 0333 07/18/17 0323  NA 141  --  141 143 139  K 3.8  --  3.1* 3.7 2.8*  CL 107  --  109 112* 110  CO2 25  --  24 23 22   GLUCOSE 128*  --  110* 113* 100*  BUN 21*  --  16 13 8   CREATININE 0.68 0.57 0.56 0.52 0.44  CALCIUM 8.7*  --  8.3* 8.2* 7.8*  MG  --   --  1.7  --   --    GFR: Estimated Creatinine Clearance: 47.9 mL/min (by C-G formula based on SCr of 0.44 mg/dL). Liver Function Tests: Recent Labs  Lab 07/15/17 1352 07/16/17 0756  AST 43* 25  ALT 14 13*  ALKPHOS 152* 126  BILITOT 0.5 0.9  PROT 5.8* 5.4*  ALBUMIN 1.9* 1.8*   No results for input(s): LIPASE, AMYLASE in the last 168 hours. No results for input(s): AMMONIA in the last 168 hours. Coagulation Profile: Recent Labs  Lab 07/15/17 2204  INR 1.45   Cardiac Enzymes: No results for input(s): CKTOTAL, CKMB, CKMBINDEX, TROPONINI in the last 168 hours. BNP (last 3 results) No results for input(s): PROBNP in the last 8760 hours. HbA1C: Recent Labs    07/16/17 0125  HGBA1C 6.4*   CBG: Recent Labs  Lab 07/17/17 1941 07/17/17 2331 07/18/17 0319  07/18/17 0750 07/18/17 1202  GLUCAP 106* 108* 107* 95 110*   Lipid Profile: No results for input(s): CHOL, HDL, LDLCALC, TRIG, CHOLHDL, LDLDIRECT in the last 72 hours. Thyroid Function Tests: No results for input(s): TSH, T4TOTAL, FREET4, T3FREE, THYROIDAB in the last 72 hours. Anemia Panel: No results for input(s): VITAMINB12, FOLATE, FERRITIN, TIBC, IRON, RETICCTPCT in the last 72 hours. Urine analysis:    Component Value Date/Time   COLORURINE AMBER (A) 07/15/2017 1411   APPEARANCEUR CLOUDY (A) 07/15/2017 1411   LABSPEC 1.021 07/15/2017 1411   PHURINE 5.0 07/15/2017 1411   GLUCOSEU 50 (A) 07/15/2017 1411   HGBUR SMALL (A) 07/15/2017 1411   BILIRUBINUR NEGATIVE 07/15/2017 1411   KETONESUR 5 (A) 07/15/2017 1411   PROTEINUR 30 (A) 07/15/2017 1411   NITRITE POSITIVE (A) 07/15/2017 1411   LEUKOCYTESUR LARGE (A) 07/15/2017 1411   Recent Results (from the past 240 hour(s))  Urine culture     Status: Abnormal   Collection Time: 07/15/17  2:11 PM  Result Value Ref Range Status   Specimen Description URINE, CLEAN CATCH  Final   Special Requests NONE  Final   Culture MULTIPLE SPECIES PRESENT, SUGGEST RECOLLECTION (A)  Final   Report Status 07/16/2017 FINAL  Final  Culture, blood (Routine x 2)     Status: None (Preliminary result)   Collection Time: 07/15/17  2:12 PM  Result Value Ref Range Status   Specimen Description BLOOD RIGHT FOREARM  Final   Special Requests IN PEDIATRIC BOTTLE Blood Culture adequate volume  Final   Culture   Final    NO GROWTH 3 DAYS Performed at [facility], [address] [place], [place] [address]    Report Status PENDING  Incomplete  MRSA PCR Screening     Status: None   Collection Time: 07/15/17  6:36 PM  Result Value Ref Range Status   MRSA by PCR NEGATIVE NEGATIVE Final    Comment:        The GeneXpert MRSA Assay (FDA approved for NASAL specimens only), is one component of a comprehensive MRSA colonization surveillance program. It is  not intended to diagnose MRSA infection nor to guide or monitor treatment for MRSA infections.   Culture, blood (Routine x 2)     Status: None (Preliminary result)   Collection Time: 07/15/17  6:39 PM  Result Value Ref Range Status   Specimen Description BLOOD RIGHT HAND  Final   Special Requests IN PEDIATRIC BOTTLE Blood Culture adequate volume  Final   Culture   Final    NO GROWTH 3 DAYS Performed at [facility], [address] [place], [place] [address]    Report Status PENDING  Incomplete      Radiology Studies: No results found.  Scheduled Meds: . chlorhexidine  15 mL Mouth Rinse BID  . Chlorhexidine Gluconate Cloth  6 each Topical Daily  . enoxaparin (LOVENOX) injection  70 mg Subcutaneous BID  . insulin aspart  0-9 Units Subcutaneous Q4H  . mouth rinse  15 mL Mouth Rinse q12n4p  . polyvinyl alcohol  1 drop Both Eyes BID   Continuous Infusions: . dextrose 5 % and 0.45% NaCl 1,000 mL with potassium chloride 40 mEq infusion    . diltiazem (CARDIZEM) infusion 10 mg/hr (07/18/17 1131)  . magnesium sulfate 1 - 4 g bolus IVPB 2 g (07/18/17 1135)  . piperacillin-tazobactam (ZOSYN)  IV Stopped (07/18/17 0956)  . potassium chloride 10 mEq (07/18/17 1130)  . vancomycin Stopped (07/17/17 1505)     LOS: 3 days   Time spent: 35 minutes.  Hazeline Junkeryan Garrell Flagg, MD [facility] Pager [phone]  If 7PM-7AM, please contact night-coverage [email or web address] Password [email or web address] 07/18/2017, 12:26 PM

## 2017-07-18 NOTE — Progress Notes (Addendum)
Pharmacy Antibiotic Note/Anticoagulation Note  Sara Gross is a 84 y.o. female admitted on 07/15/2017 with sepsis due to pneumonia. Patient has sacral decubitus ulcer recently treated with 5 week course of vancomycin. Per MD, sacral wound does not appear infected.  Pharmacy has been consulted for Vancomycin and Zosyn dosing. Day #4 Vancomycin and Zosyn for HCAP.  Vancomycin will be discontinued today since nasal MRSA PCR screen negative. A negative PCR is consistent with >98% negative predictive value in correlating with negative MRSA pneumonia.   Patient also noted to be in new Afib with RVR; pharmacy consulted to dose lovenox. Hgb low at baseline, trended down slightly today; Plt WNL. No bleeding/complications reported. Discussion regarding long-term plan for anticoagulation ongoing.  SCr low/stable.   Plan: 1.  Continue Zosyn 3.375g IV q8h (4 hour infusion time). Dosage remains stable and need for further dosage adjustment appears unlikely at present.  Pharmacy will sign off Zosyn consult. 2.  Continue Lovenox 70 mg (1 mg/kg) SQ q12h.  Height: 5\' 5"  (165.1 cm) Weight: 162 lb (73.5 kg) IBW/kg (Calculated) : 57  Temp (24hrs), Avg:98.4 F (36.9 C), Min:97.7 F (36.5 C), Max:98.9 F (37.2 C)  Recent Labs  Lab 07/15/17 1352 07/15/17 1439 07/15/17 2204 07/16/17 0125 07/16/17 0756 07/17/17 0333 07/18/17 0323  WBC 14.1*  --   --   --  12.5* 11.3* 10.2  CREATININE 0.68  --   --  0.57 0.56 0.52 0.44  LATICACIDVEN  --  3.42* 2.2* 1.6  --   --   --   VANCORANDOM  --   --   --  22  --   --   --     Estimated Creatinine Clearance: 47.9 mL/min (by C-G formula based on SCr of 0.44 mg/dL).    Allergies  Allergen Reactions  . Januvia [Sitagliptin]     transaminitis     Antimicrobials this admission: PTA Vanc >> 1/31 1/28 Zosyn >>  Dose adjustments this admission: PTA Vanc 750mg  q24h (Calculated VPk=29 and VT=13, AUC 475 w/ SCr rounded to 1) Will order VRm with AM labs tomorrow to  ensure patient not toxic. 1/29 VRm = 22 (drawn ~8hr post dose)   Microbiology results: 1/28 BCx: ngtd 1/28 UCx: multiple species  1/28 MRSA PCR: neg  Thank you for allowing pharmacy to be a part of this patient's care.  Clance BollRunyon, Catlyn Shipton 07/18/2017 1:01 PM

## 2017-07-19 DIAGNOSIS — R600 Localized edema: Secondary | ICD-10-CM

## 2017-07-19 DIAGNOSIS — L89303 Pressure ulcer of unspecified buttock, stage 3: Secondary | ICD-10-CM

## 2017-07-19 DIAGNOSIS — R5381 Other malaise: Secondary | ICD-10-CM

## 2017-07-19 DIAGNOSIS — E8809 Other disorders of plasma-protein metabolism, not elsewhere classified: Secondary | ICD-10-CM

## 2017-07-19 DIAGNOSIS — Z515 Encounter for palliative care: Secondary | ICD-10-CM

## 2017-07-19 DIAGNOSIS — I4891 Unspecified atrial fibrillation: Secondary | ICD-10-CM

## 2017-07-19 DIAGNOSIS — R54 Age-related physical debility: Secondary | ICD-10-CM

## 2017-07-19 DIAGNOSIS — F0390 Unspecified dementia without behavioral disturbance: Secondary | ICD-10-CM

## 2017-07-19 DIAGNOSIS — Z7189 Other specified counseling: Secondary | ICD-10-CM

## 2017-07-19 DIAGNOSIS — J189 Pneumonia, unspecified organism: Secondary | ICD-10-CM

## 2017-07-19 DIAGNOSIS — L89154 Pressure ulcer of sacral region, stage 4: Secondary | ICD-10-CM

## 2017-07-19 LAB — BASIC METABOLIC PANEL
Anion gap: 6 (ref 5–15)
BUN: 6 mg/dL (ref 6–20)
CHLORIDE: 109 mmol/L (ref 101–111)
CO2: 26 mmol/L (ref 22–32)
Calcium: 8 mg/dL — ABNORMAL LOW (ref 8.9–10.3)
Creatinine, Ser: 0.44 mg/dL (ref 0.44–1.00)
GFR calc Af Amer: >60 mL/min (ref >60)
GFR calc non Af Amer: >60 mL/min (ref >60)
GLUCOSE: 127 mg/dL — AB (ref 65–99)
POTASSIUM: 3.7 mmol/L (ref 3.5–5.1)
Sodium: 141 mmol/L (ref 135–145)

## 2017-07-19 LAB — CBC
HEMATOCRIT: 23.4 % — AB (ref 36.0–46.0)
HEMOGLOBIN: 7.8 g/dL — AB (ref 12.0–15.0)
MCH: 25.7 pg — AB (ref 26.0–34.0)
MCHC: 33.3 g/dL (ref 30.0–36.0)
MCV: 77.2 fL — AB (ref 78.0–100.0)
Platelets: 370 10*3/uL (ref 150–400)
RBC: 3.03 MIL/uL — ABNORMAL LOW (ref 3.87–5.11)
RDW: 19.2 % — ABNORMAL HIGH (ref 11.5–15.5)
WBC: 9.3 10*3/uL (ref 4.0–10.5)

## 2017-07-19 LAB — GLUCOSE, CAPILLARY
Glucose-Capillary: 126 mg/dL — ABNORMAL HIGH (ref 65–99)
Glucose-Capillary: 116 mg/dL — ABNORMAL HIGH (ref 65–99)

## 2017-07-19 LAB — MAGNESIUM: Magnesium: 1.8 mg/dL (ref 1.7–2.4)

## 2017-07-19 MED ORDER — HALOPERIDOL LACTATE 5 MG/ML IJ SOLN: 2.0000 mg | INTRAMUSCULAR | Status: DC | PRN

## 2017-07-19 MED ORDER — SODIUM CHLORIDE 0.9 % IV SOLN
1.0000 mg/h | INTRAVENOUS | Status: DC
  Administered 2017-07-19: 1 mg/h via INTRAVENOUS
  Filled 2017-07-19: qty 10

## 2017-07-19 MED ORDER — MORPHINE BOLUS VIA INFUSION
4.0000 mg | INTRAVENOUS | Status: DC | PRN
  Administered 2017-07-19: 4 mg via INTRAVENOUS
  Filled 2017-07-19: qty 4

## 2017-07-19 MED ORDER — BIOTENE DRY MOUTH MT LIQD: 15.0000 mL | OROMUCOSAL | Status: DC | PRN

## 2017-07-19 MED ORDER — ACETAMINOPHEN 650 MG RE SUPP: 650.0000 mg | Freq: Four times a day (QID) | RECTAL | Status: DC | PRN

## 2017-07-19 MED ORDER — GLYCOPYRROLATE 1 MG PO TABS: 1.0000 mg | ORAL_TABLET | ORAL | Status: DC | PRN

## 2017-07-19 MED ORDER — GLYCOPYRROLATE 0.2 MG/ML IJ SOLN: 0.2000 mg | INTRAMUSCULAR | Status: DC | PRN

## 2017-07-19 MED ORDER — HALOPERIDOL LACTATE 2 MG/ML PO CONC
2.0000 mg | ORAL | Status: DC | PRN
  Filled 2017-07-19: qty 1

## 2017-07-19 MED ORDER — ACETAMINOPHEN 325 MG PO TABS: 650.0000 mg | ORAL_TABLET | Freq: Four times a day (QID) | ORAL | Status: DC | PRN

## 2017-07-19 MED ORDER — POLYVINYL ALCOHOL 1.4 % OP SOLN
1.0000 [drp] | Freq: Four times a day (QID) | OPHTHALMIC | Status: DC | PRN
  Filled 2017-07-19: qty 15

## 2017-07-19 NOTE — Progress Notes (Signed)
[facility]. [facility]. Necessary paperwork handed off to [facility]. Patient discharged with Lt. Sided PIV in place per RN request. Patient belongings returned to daughter.

## 2017-07-19 NOTE — Progress Notes (Signed)
CSW received consult from [employer] to assist with discharge to residential  hospice home.   CSW met with patient daughter to discuss facility options. Patient daughter prefers [facility] home but is agreeable to [facility] and [facility] if space is available.   CSW provided emotional support, as patient daughter reports "feeling overwhelmed and not ready." She reports, "after watching them take away the IV's everything became real." CSW provided emotional support. She reports family is coming from out of town to be with her.  CSW faxed referral clinical documentation to [facility] in [place].    CSW provided referral to [facility] w/ [facility]   CSW will conitine to assist with placement.   Kathrin Greathouse, Latanya Presser, MSW Clinical Social Worker  [phone] 07/19/2017  12:24 PM

## 2017-07-19 NOTE — Progress Notes (Signed)
PROGRESS NOTE  Sara LeventhalHazel Gross  ZOX:[id number] DOB: 03/23/1926 DOA: 07/15/2017 PCP: [name], [name], MD   Brief Narrative: Sara LeventhalHazel Bredeson is a [age] y.o. female with a history of IDT2DM, HTN, dementia total care with 1 year functional decline, hypothyroidism, DVT s/p IVF filter who presented from SNF due to cough, fever found to have pneumonia and AFib w/RVR. Speech evaluation shows severe aspiration risk, so patient is kept NPO. Palliative care is consulted.   Assessment & Plan: Active Problems:   Sepsis (HCC)   Pressure injury of skin   HCAP (healthcare-associated pneumonia)   Advance care planning   Goals of care, counseling/discussion   Palliative care by specialist   Atrial fibrillation with rapid ventricular response (HCC)   Edema of upper extremity   V-tach (HCC)  Severe sepsis due to pneumonia: Sacral decubitus appears uninfected - Continue zosyn, DC'd vancomycin as MRSA PCR negative - Monitor cultures NGTDx3 days  Dementia,total care: Prolonged decline, family meeting 1/30 elicited pt's values of autonomy. Discussed w daughter this am.  She will meet with palliative care later.  No signs of sig improvement.  Poor prognosis.  DNR now, also family agreed to no artificial feeding.  .   - Appreciate palliative care assistance.   Dysphagia: has been on pureed diet since 03/2017, currently severe aspiration risk on repeated SLP evaluations while admitted. No feeding tube per family.  - Continue oral care, aspiration precautions.   Stage 4 sacral decubitus ulcer: History of osteomyelitis by bone biopsy at OSH, treated with vancomycin x5 weeks started 12/8 after treatment in hospital. Does not appear currently infected.  - WOC consulted, TID and prn W>D dressing changes.  - Offload as able  AFib with RVR:  - Uncontrolled this morning, asked RN to increase diltiazem to 10/hr.  - On full dose lovenox, though had anticoagulation stopped for DVT in the past due to epistaxis. Will  monitor very closely. Anticoagulation d/w daughter.   Hypokalemia/hypomagnesemia: Will replace with runs and put into MIVF - Monitor daily  IDT2DM: HbA1c 6.4% - SSI, CBGs wnl. Will continue monitoring while NPO.   Anemia: Hgb near baseline, trending slightly downward with IVF's.  - Monitor CBC in AM - No evidence of bleeding    DVT prophylaxis: Full dose lovenox Code Status: DNR confirmed with family Family Communication: Family meeting 1/30.  D/ W daughter today 2/1. Disposition Plan: Uncertain, daughter and palliative care to discuss further today.  Consultants:   Palliative care team  Procedures:   None  Antimicrobials:  Vancomycin 1/28 - 1/30  Zosyn 1/28 >>   Subjective: Confused, remains drowsy  Objective: Vitals:   07/19/17 0323 07/19/17 0400 07/19/17 0500 07/19/17 0600  BP:  138/66 (!) 113/56 (!) 126/55  Pulse:  83 84 82  Resp:  13 17 20   Temp: 98 F (36.7 C)     TempSrc: Axillary     SpO2:  97% 95% 96%  Weight:      Height:        Intake/Output Summary (Last 24 hours) at 07/19/2017 0852 Last data filed at 07/19/2017 0356 Gross per 24 hour  Intake 917.92 ml  Output 475 ml  Net 442.92 ml   Filed Weights   07/15/17 1358  Weight: 73.5 kg (162 lb)    Gen: Elderly, frail female in no distress sleeping with mouth wide open. HEENT: Edentulous.  Pulm: Non-labored breathing with supplemental oxygen. Clear to auscultation bilaterally.  CV:  No murmur, rub, or gallop. No JVD, no pedal edema.  GI: Abdomen soft, non-tender, non-distended, with normoactive bowel sounds. No organomegaly or masses felt. Ext: Warm, dry Skin: Stage IV sacral pressure ulcer with undermining (see picture from progress note from 1/29). Has slough but no purulence. Heels floated w/boots. Neuro: Drowsy, rousable. Not oriented or cooperative with exam.  Psych: UTD  Data Reviewed: I have personally reviewed following labs and imaging studies  CBC: Recent Labs  Lab 07/15/17 1352  07/16/17 0756 07/17/17 0333 07/18/17 0323 07/19/17 0325  WBC 14.1* 12.5* 11.3* 10.2 9.3  NEUTROABS 11.0* 10.1*  --   --   --   HGB 8.7* 8.3* 8.0* 7.9* 7.8*  HCT 27.2* 25.5* 24.8* 23.9* 23.4*  MCV 80.7 79.9 79.7 78.4 77.2*  PLT 331 371 375 387 370   Basic Metabolic Panel: Recent Labs  Lab 07/15/17 1352 07/16/17 0125 07/16/17 0756 07/17/17 0333 07/18/17 0323 07/19/17 0325  NA 141  --  141 143 139 141  K 3.8  --  3.1* 3.7 2.8* 3.7  CL 107  --  109 112* 110 109  CO2 25  --  24 23 22 26   GLUCOSE 128*  --  110* 113* 100* 127*  BUN 21*  --  16 13 8 6   CREATININE 0.68 0.57 0.56 0.52 0.44 0.44  CALCIUM 8.7*  --  8.3* 8.2* 7.8* 8.0*  MG  --   --  1.7  --   --  1.8   GFR: Estimated Creatinine Clearance: 47.9 mL/min (by C-G formula based on SCr of 0.44 mg/dL). Liver Function Tests: Recent Labs  Lab 07/15/17 1352 07/16/17 0756  AST 43* 25  ALT 14 13*  ALKPHOS 152* 126  BILITOT 0.5 0.9  PROT 5.8* 5.4*  ALBUMIN 1.9* 1.8*   No results for input(s): LIPASE, AMYLASE in the last 168 hours. No results for input(s): AMMONIA in the last 168 hours. Coagulation Profile: Recent Labs  Lab 07/15/17 2204  INR 1.45   Cardiac Enzymes: No results for input(s): CKTOTAL, CKMB, CKMBINDEX, TROPONINI in the last 168 hours. BNP (last 3 results) No results for input(s): PROBNP in the last 8760 hours. HbA1C: No results for input(s): HGBA1C in the last 72 hours. CBG: Recent Labs  Lab 07/18/17 1557 07/18/17 1956 07/18/17 2357 07/19/17 0322 07/19/17 0828  GLUCAP 116* 119* 135* 126* 116*   Lipid Profile: No results for input(s): CHOL, HDL, LDLCALC, TRIG, CHOLHDL, LDLDIRECT in the last 72 hours. Thyroid Function Tests: No results for input(s): TSH, T4TOTAL, FREET4, T3FREE, THYROIDAB in the last 72 hours. Anemia Panel: No results for input(s): VITAMINB12, FOLATE, FERRITIN, TIBC, IRON, RETICCTPCT in the last 72 hours. Urine analysis:    Component Value Date/Time   COLORURINE AMBER (A)  07/15/2017 1411   APPEARANCEUR CLOUDY (A) 07/15/2017 1411   LABSPEC 1.021 07/15/2017 1411   PHURINE 5.0 07/15/2017 1411   GLUCOSEU 50 (A) 07/15/2017 1411   HGBUR SMALL (A) 07/15/2017 1411   BILIRUBINUR NEGATIVE 07/15/2017 1411   KETONESUR 5 (A) 07/15/2017 1411   PROTEINUR 30 (A) 07/15/2017 1411   NITRITE POSITIVE (A) 07/15/2017 1411   LEUKOCYTESUR LARGE (A) 07/15/2017 1411   Recent Results (from the past 240 hour(s))  Urine culture     Status: Abnormal   Collection Time: 07/15/17  2:11 PM  Result Value Ref Range Status   Specimen Description URINE, CLEAN CATCH  Final   Special Requests NONE  Final   Culture MULTIPLE SPECIES PRESENT, SUGGEST RECOLLECTION (A)  Final   Report Status 07/16/2017 FINAL  Final  Culture, blood (  Routine x 2)     Status: None (Preliminary result)   Collection Time: 07/15/17  2:12 PM  Result Value Ref Range Status   Specimen Description BLOOD RIGHT FOREARM  Final   Special Requests IN PEDIATRIC BOTTLE Blood Culture adequate volume  Final   Culture   Final    NO GROWTH 3 DAYS Performed at [facility], [address], [place], [place] [address]    Report Status PENDING  Incomplete  MRSA PCR Screening     Status: None   Collection Time: 07/15/17  6:36 PM  Result Value Ref Range Status   MRSA by PCR NEGATIVE NEGATIVE Final    Comment:        The GeneXpert MRSA Assay (FDA approved for NASAL specimens only), is one component of a comprehensive MRSA colonization surveillance program. It is not intended to diagnose MRSA infection nor to guide or monitor treatment for MRSA infections.   Culture, blood (Routine x 2)     Status: None (Preliminary result)   Collection Time: 07/15/17  6:39 PM  Result Value Ref Range Status   Specimen Description BLOOD RIGHT HAND  Final   Special Requests IN PEDIATRIC BOTTLE Blood Culture adequate volume  Final   Culture   Final    NO GROWTH 3 DAYS Performed at [facility], [address], [place],  [place] [address]    Report Status PENDING  Incomplete      Radiology Studies: No results found.  Scheduled Meds: . chlorhexidine  15 mL Mouth Rinse BID  . Chlorhexidine Gluconate Cloth  6 each Topical Daily  . enoxaparin (LOVENOX) injection  70 mg Subcutaneous BID  . insulin aspart  0-9 Units Subcutaneous Q4H  . mouth rinse  15 mL Mouth Rinse q12n4p  . polyvinyl alcohol  1 drop Both Eyes BID   Continuous Infusions: . dextrose 5 % and 0.45 % NaCl with KCl 40 mEq/L 75 mL/hr at 07/19/17 0344  . diltiazem (CARDIZEM) infusion 5 mg/hr (07/18/17 2010)  . piperacillin-tazobactam (ZOSYN)  IV 3.375 g (07/19/17 0545)     LOS: 4 days   Time spent: 35 minutes.  Vinson Moselle MD [facility] pgr [phone] 07/19/2017, 8:52 AM  If 7PM-7AM, please contact night-coverage [email or web address] Password [facility] 07/19/2017, 8:52 AM

## 2017-07-19 NOTE — Progress Notes (Signed)
[facility] Liaison: RN visit  Received request from Kathrin Greathouse, [place] for family interest in [facility] with request for transfer today. Chart reviewed. Met and spoke with daughter Remonia Richter to confirm interest and explain services. Family agreeable to transfer today. Elmyra Ricks CSW aware. Registration paperwork completed. Dr. [facility] Melter to assume care per family request. Please fax discharge summary to [phone]. RN please call report to [phone]. Please arrange transport for patient to arrive as soon as possible.  Thank you,  Farrel Gordon, RN, [facility] Liaison [place] are on AMION.

## 2017-07-19 NOTE — Progress Notes (Signed)
Nutrition Brief Note  RD note on 1/29 for initial assessment. Chart reviewed. Pt now transitioning to comfort care. [facility].  No further nutrition interventions warranted at this time.  Please re-consult as needed.      Trenton GammonJessica Amere Iott, MS, RD, LDN, [facility] Inpatient Clinical Dietitian Pager # [phone] After hours/weekend pager # [phone]

## 2017-07-19 NOTE — Progress Notes (Signed)
   07/19/17 1500  Clinical Encounter Type  Visited With Patient and family together  Visit Type Initial;Psychological support;Spiritual support  Referral From Palliative care team  Consult/Referral To Chaplain  Spiritual Encounters  Spiritual Needs Emotional;Other (Comment) (Spiritual Care Conversation/Support)  Stress Factors  Patient Stress Factors Not reviewed  Family Stress Factors Health changes;Major life changes   When I arrived Hospice was present in the room completing paperwork.  The patient's daughter was at the bedside. She did not specify any needs at this time, but requested my card.  Please, contact Spiritual Care for further assistance.  Chaplain Clint BolderBrittany Latessa Tillis M.Div., [facility]

## 2017-07-19 NOTE — Progress Notes (Signed)
250 ml of Morphine wasted in the sink. Witnessed by Owens-IllinoisCristy, Charity fundraiserN.

## 2017-07-19 NOTE — Progress Notes (Signed)
D/C summary sent.  DNR signed. Med. Nes. Form complete.  Nurse to call report.  4:10PM PT[facility] called for transport. ETA:1 HOUR  Vivi BarrackNicole Savanna Dooley, Theresia MajorsLCSWA, MSW Clinical Social Worker  [phone] 07/19/2017  4:13 PM

## 2017-07-19 NOTE — Progress Notes (Signed)
Date: July 19, 2017 Sara SmilingRhonda Clete Gross, BSN, [place]RN3, [place]CCM [phone] Chart and notes review for patient progress and needs./remains on iv Cardizem due to a.fib with rvr/iv abx/ Will follow for case management and discharge needs. No cm or discharge needs present at time of this review. Next review date: [id number]

## 2017-07-19 NOTE — Progress Notes (Signed)
Spoke at length (60 min+) with daughter Sara Gross.  We primarily discussed the conditions at [facility] and how disappointed Sara Gross has been with the care of her mother.  Sara Gross understands her mother is dying of dementia, recurrent infections, non-healing sacral wound with osteo, failing heart, and recurrent aspiration.   Her mother has been preparing her for months - telling her she is ready to go.  Sara Gross agrees to full comfort care.  She would like for her [facility]  [facility]  I will make changes to the orders to reflect full comfort and place an order to social work for hospice house placement.  Sara Gross asks if I can be available to her family when they arrive from out of town today.  I will do my best to be available.  Sara RichardsMarianne Zakaree Mcclenahan, PA-C Palliative Medicine Pager: [phone]   Total time 75 min IN:  9:30 Out:  10:45

## 2017-07-19 NOTE — Discharge Summary (Addendum)
Physician Discharge Summary  Patient ID: Sara Gross MRN: [id number] DOB/AGE: [birth year] 84 y.o.  Admit date: 07/15/2017 Discharge date: 07/19/2017  Admission Diagnoses: Active Problems:   Sepsis (HCC)   Pressure injury of skin   HCAP (healthcare-associated pneumonia)   Atrial fibrillation with rapid ventricular response (HCC)   Edema of upper extremity   V-tach [facility])   Discharge Diagnoses:  Active Problems:   Debility   Dementia without behavioral disturbance   Frailty syndrome in geriatric patient   Hypoalbuminemia   Pressure injury of skin   HCAP (healthcare-associated pneumonia)   Advance care planning   Goals of care, counseling/discussion   Palliative care by specialist   Atrial fibrillation with rapid ventricular response (HCC)   Edema of upper extremity   Discharged Condition: poor  Hospital Course:  Severe sepsis due to pneumonia: - received 3- 4 days of IV abx, overall condition and MS not improving significantly. Palliative care consulted and patient is now going to be transitioned to comfort care due to overall significant long term decline and patients health care wishes.  See also pall care notes. Plan is for dc today to [facility] facility.    Dementia,total care: Prolonged decline, family meeting 1/30 elicited pt's values of autonomy. Discussed w daughter this am. Plan as above   Dysphagia:has been on pureed diet since 03/2017, currently severe aspiration risk on repeated SLP evaluations while admitted. No feeding tube per family  Stage 4 sacral decubitus ulcer: History of osteomyelitis by bone biopsy at OSH, treated with vancomycin x5 weeks started 12/8 after treatment in hospital. Does not appear currently infected.  AFib with RVR:  - no further Rx  Hypokalemia/hypomagnesemia:    IDT2DM: HbA1c 6.4%   Anemia: Hgb near baseline, trending slightly downward with IVF's.  - No evidence of bleeding    Consultants:    Palliative care team  Procedures:   None  Antimicrobials:  Vancomycin 1/28 - 1/30  Zosyn 1/28 >>    Discharge Exam: Blood pressure (!) 124/51, pulse 87, temperature 98.2 F (36.8 C), temperature source Axillary, resp. rate 19, height 5\' 5"  (1.651 m), weight 73.5 kg (162 lb), SpO2 100 %. Gen: Elderly, frail female in no distress sleeping with mouth wide open. HEENT: Edentulous.  Pulm: Non-labored breathing with supplemental oxygen. Clear to auscultation bilaterally.  CV:  No murmur, rub, or gallop. No JVD, no pedal edema. GI: Abdomen soft, non-tender, non-distended, with normoactive bowel sounds. No organomegaly or masses felt. Ext: Warm, dry Skin: Stage IV sacral pressure ulcer with undermining (see picture from progress note from 1/29). Has slough but no purulence. Heels floated w/boots. Neuro: Drowsy, rousable. Not oriented or cooperative with exam.  Psych: UTD    Disposition: Residential hospice facility  Discharge Instructions    Discharge patient   Complete by:  As directed    Residential hospice   Discharge disposition:  [facility] Not Defined   Discharge patient date:  07/19/2017     Allergies as of 07/19/2017      Reactions   Januvia [sitagliptin]    transaminitis      Medication List    STOP taking these medications   acetaminophen 325 MG tablet Commonly known as:  TYLENOL   amLODipine 5 MG tablet Commonly known as:  NORVASC   amoxicillin-clavulanate 875-125 MG tablet Commonly known as:  AUGMENTIN   aspirin EC 81 MG tablet   feeding supplement (PRO-STAT SUGAR FREE 64) Liqd   GLUCERNA Liqd   levothyroxine 112  MCG tablet Commonly known as:  SYNTHROID, LEVOTHROID   NON FORMULARY   nutrition supplement (JUVEN) Pack   pantoprazole 40 MG tablet Commonly known as:  PROTONIX   polyethylene glycol packet Commonly known as:  MIRALAX / GLYCOLAX   potassium chloride 10 MEQ tablet Commonly known as:  K-DUR,KLOR-CON    traMADol 50 MG tablet Commonly known as:  ULTRAM   vancomycin 1,000 mg in sodium chloride 0.9 % 250 mL   Vancomycin HCl in NaCl 1.25-0.9 GM/150ML-% Soln   vitamin C 500 MG tablet Commonly known as:  ASCORBIC ACID     TAKE these medications   HUMALOG KWIKPEN 100 UNIT/ML KiwkPen Generic drug:  insulin lispro Inject into the skin. Sliding scale 70-120 =0 units, 121-150= 2 units, 151-200= 3 units, 201-250=5 units, 251-300=8 units, 301-350=11 units, 351-400=15 units. Greater than 400 call MD and give 15 units.   LUBRICANT EYE DROPS OP Place 1 drop into both eyes 2 (two) times daily.        Signed: Barbette HairRobert D Cai Flott 07/19/2017, 3:57 PM

## 2017-07-19 NOTE — Progress Notes (Signed)
SLP Cancellation Note  Patient Details Name: Sara LeventhalHazel Ironside MRN: [id number] DOB: 09/02/1925   Cancelled treatment:       Reason Eval/Treat Not Completed: Fatigue/lethargy limiting ability to participate. Pt currently insufficiently arousable for assessment of po readiness. Observation of oral cavity reveals dried blood on velum and posterior lingual surface. RN is aware, and reports pt does not cooperate well with attempts for oral care, but is aware of the situation. Palliative Care meeting ongoing at this time for establishment of goals of care. ST will continue to follow and proceed as appropriate.  Celia B. Murvin NatalBueche, Spar[facility] Clinton HospitalMSP, CCC-SLP Speech Language Pathologist [phone]  Leigh AuroraBueche, Celia Brown 07/19/2017, 10:12 AM

## 2017-07-20 LAB — CULTURE, BLOOD (ROUTINE X 2)
CULTURE: NO GROWTH
Culture: NO GROWTH
SPECIAL REQUESTS: ADEQUATE
Special Requests: ADEQUATE

## 2017-08-16 DEATH — deceased

## 2020-01-04 IMAGING — CR DG CHEST 2V
2 series · 2 of 2 positions shown · non-contrast
Comparison: None.

CLINICAL DATA: Cough, congestion and fever for several days.

EXAM:
CHEST  2 VIEW

[w chest lat]
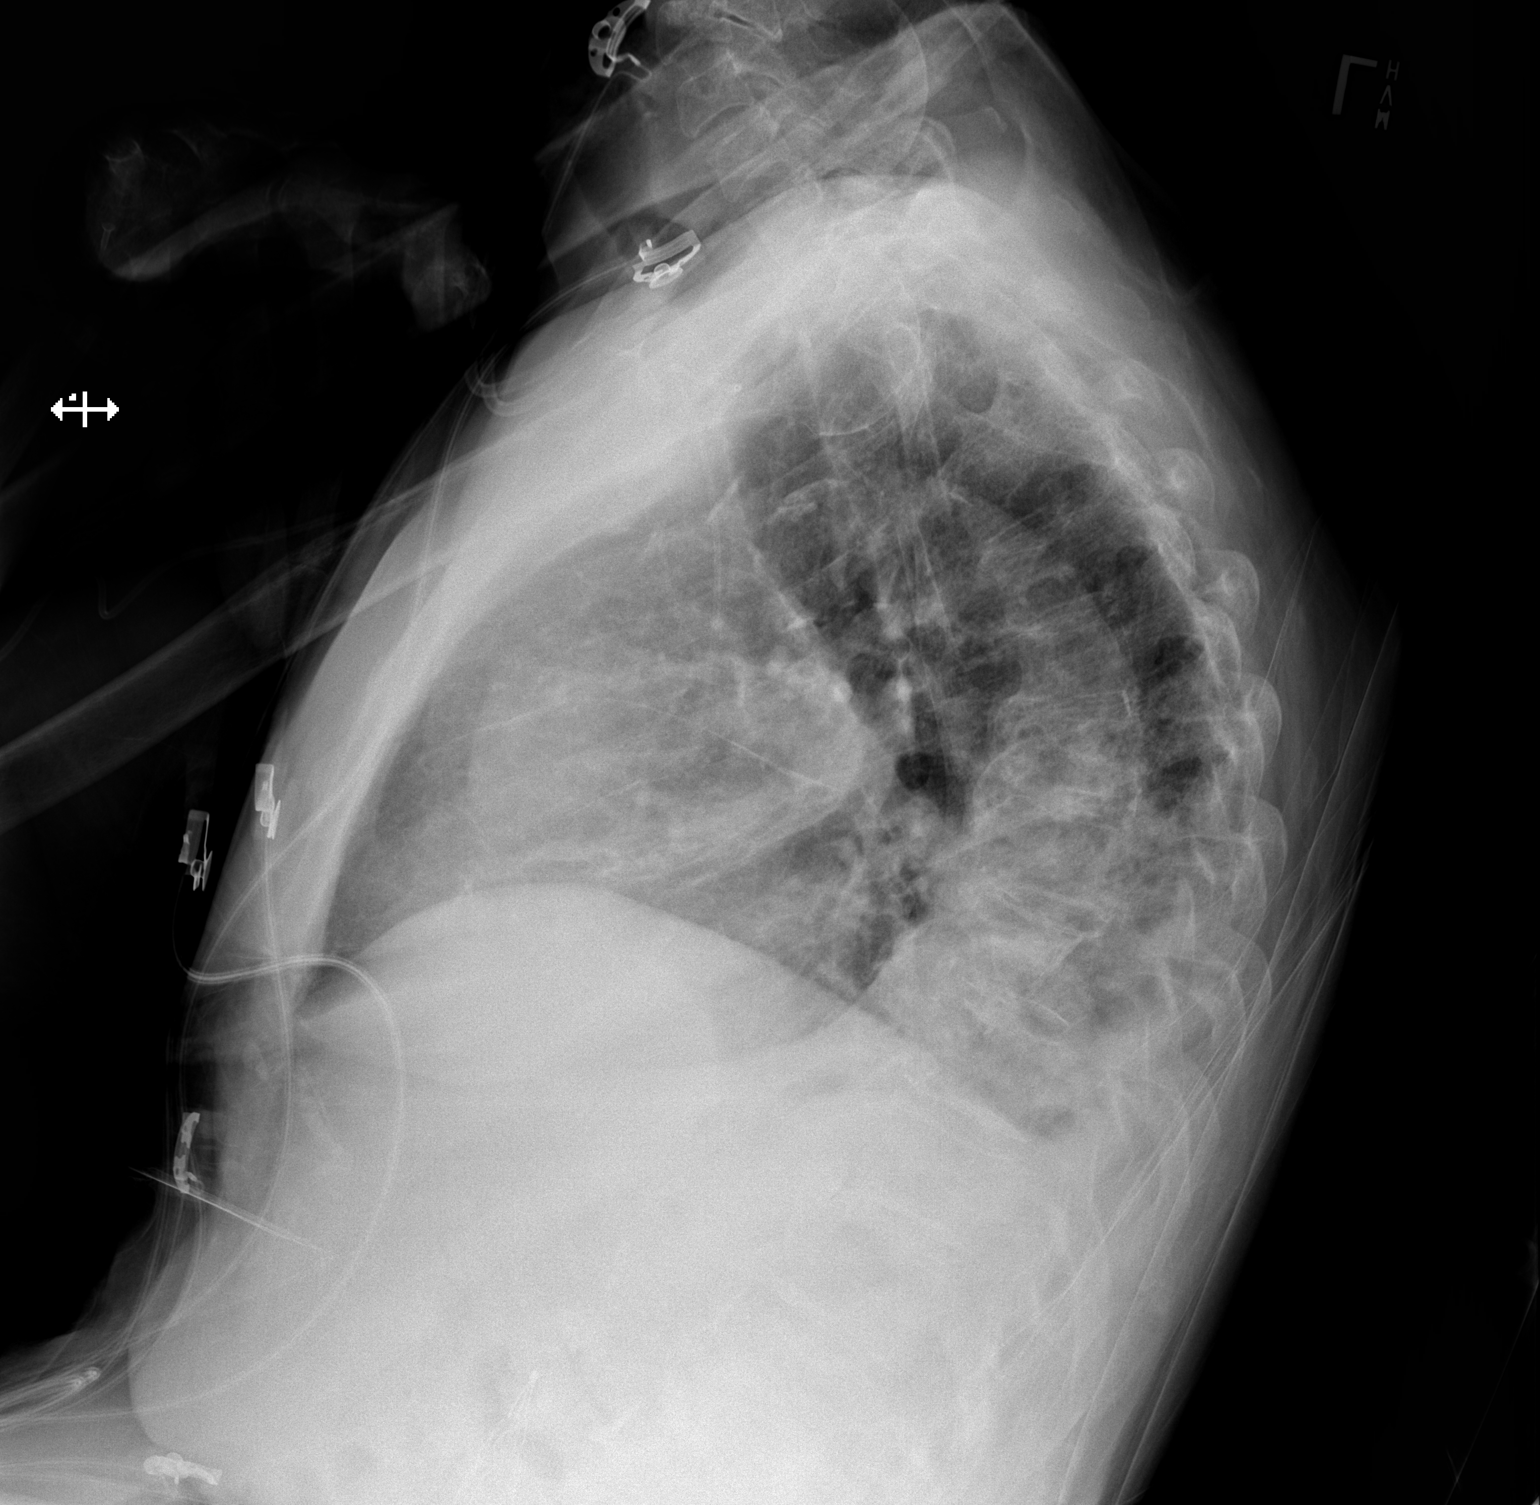

[x chest ap]
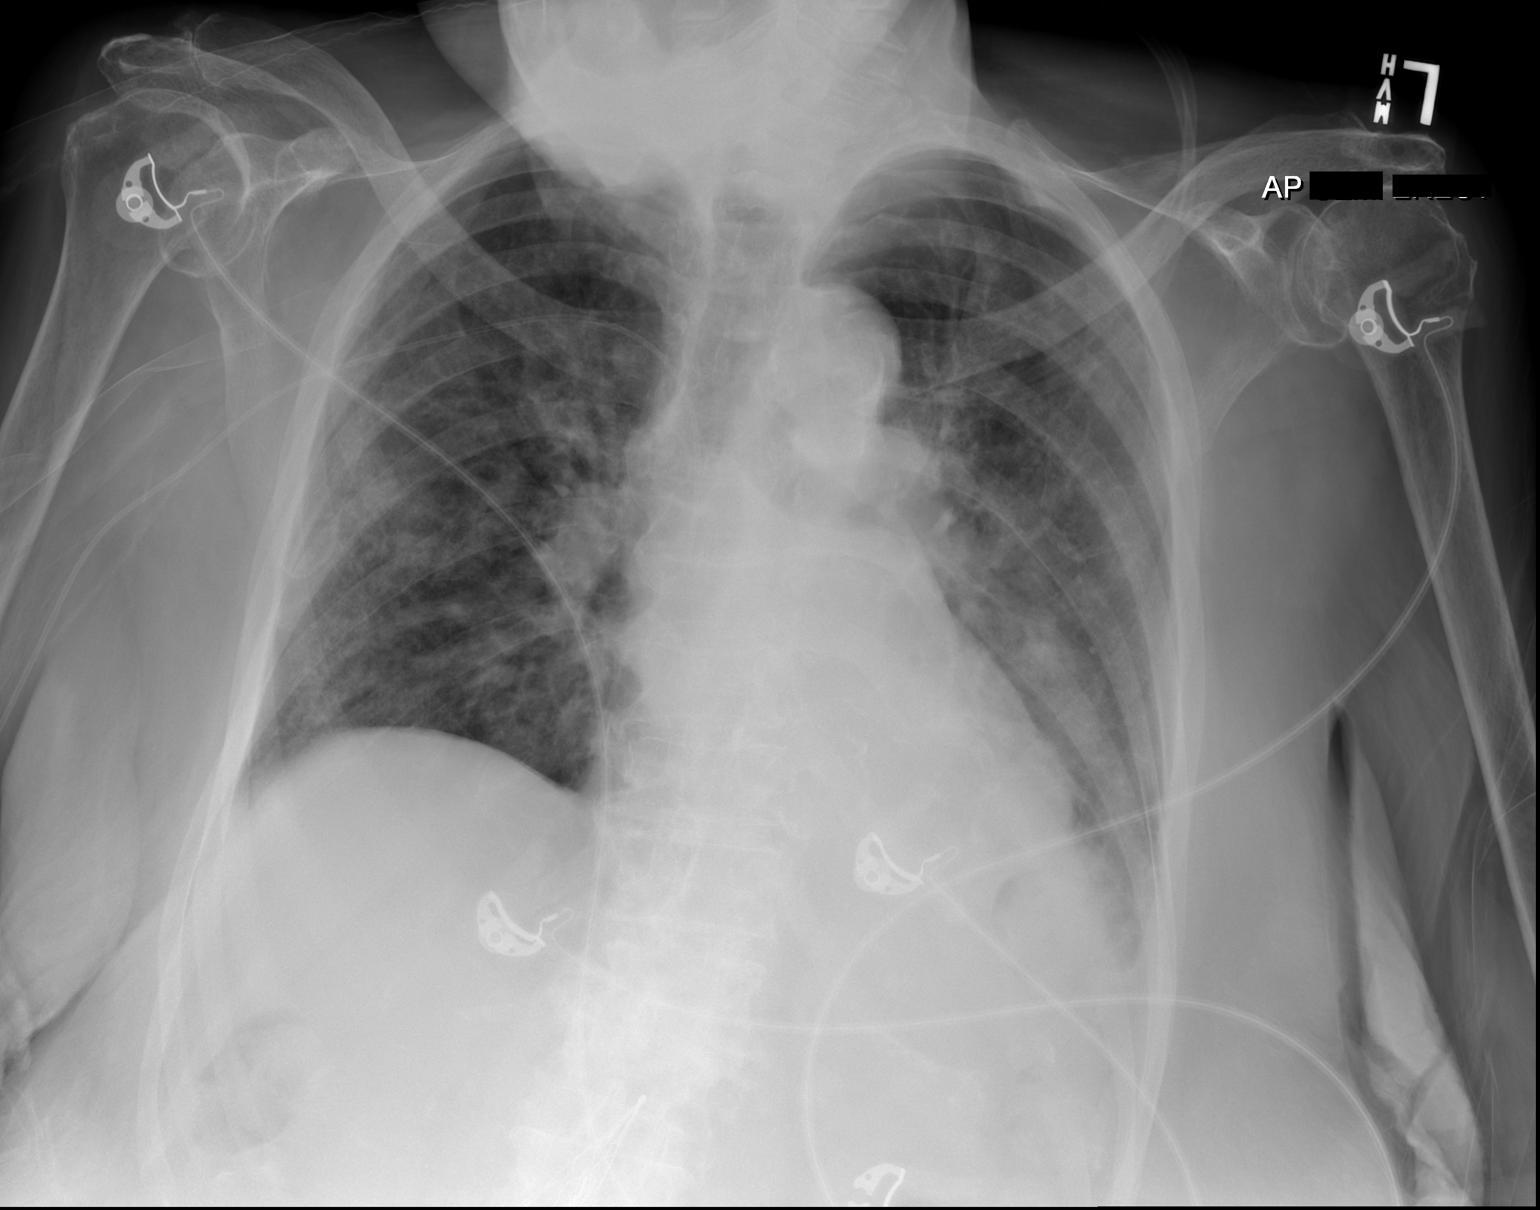

[2 of 2 positions shown; findings below may reference images not displayed]

FINDINGS: The cardiomediastinal silhouette is unremarkable.

Patchy bilateral airspace opacities, greatest in the left lower
lobe, are suspicious for pneumonia.

There may be a trace left pleural effusion present.

No dominant mass or pneumothorax noted.

No acute bony abnormalities are identified.
IMPRESSION: Patchy bilateral airspace opacities, greatest in the left lower
lobe-suspicious for pneumonia. Radiographic follow-up to resolution
recommended.
